# Patient Record
Sex: Female | Born: 2000 | Hispanic: No | Marital: Single | Smoking: Never smoker
Health system: Southern US, Community
[De-identification: ages and names within clinical notes are randomized; demographics above are authoritative.]

## PROBLEM LIST (undated history)

## (undated) DIAGNOSIS — C493 Malignant neoplasm of connective and soft tissue of thorax: Secondary | ICD-10-CM

## (undated) DIAGNOSIS — K3580 Unspecified acute appendicitis: Secondary | ICD-10-CM

## (undated) DIAGNOSIS — C499 Malignant neoplasm of connective and soft tissue, unspecified: Secondary | ICD-10-CM

## (undated) HISTORY — DX: Unspecified acute appendicitis: K35.80

## (undated) HISTORY — DX: Malignant neoplasm of connective and soft tissue of thorax: C49.3

## (undated) HISTORY — PX: TONSILLECTOMY: SUR1361

## (undated) HISTORY — PX: WISDOM TOOTH EXTRACTION: SHX21

---
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
## 2019-01-01 ENCOUNTER — Other Ambulatory Visit: Payer: Self-pay | Admitting: Nurse Practitioner

## 2019-01-01 DIAGNOSIS — R221 Localized swelling, mass and lump, neck: Secondary | ICD-10-CM

## 2019-01-01 DIAGNOSIS — Z85831 Personal history of malignant neoplasm of soft tissue: Secondary | ICD-10-CM

## 2019-01-01 NOTE — Telephone Encounter (Signed)
Prior authorization for the MRI was denied. Please advise.  Based on [employer] On[employer] Imaging Guidelines Section: ONC 1.1 Key Principles, we cannot approve this request. Your records show that you have had cancer in the past. The reason this request cannot be approved is because: 1. There is insufficient clinical information to approve the requested study. This information would include results of laboratory studies relevant to the imaging procedure requested. Imaging is not supported without these results and, therefore, the request is not indicated at this time. 2. There is insufficient clinical information to approve the requested study. This information would include results of imaging studies relevant to the imaging procedure requested. Imaging is not supported without these results and, therefore, the request is not indicated at this time. 3. The clinical information provided does not describe the results of a recent (generally within 60 days) focused clinical evaluation demonstrating new or worsening signs, symptoms, and/or laboratory values that indicate a possible recurrence or progression of disease. Ongoing routine requested study of patients who are asymptomatic or have stable chronic symptoms is not supported for this malignancy and, therefore, the request is not indicated at this time.

## 2019-01-01 NOTE — Telephone Encounter (Signed)
Thank you. I have ordered an ultrasound to schedule instead.

## 2019-01-02 NOTE — Telephone Encounter (Addendum)
Patient is scheduled at [facility] 01/09/2019 @ 1:15 pm.  Can you notify patient of appointment?

## 2019-01-02 NOTE — Telephone Encounter (Signed)
Completed prior authorization for CPT [phone] on the [facility] website. Prior authorization was [id number], valid 01/02/19-07/01/19).

## 2019-01-02 NOTE — Telephone Encounter (Signed)
Called the pt and informed her that the appointment is 01/09/19 at 1:15 at [facility]

## 2019-01-09 ENCOUNTER — Ambulatory Visit (HOSPITAL_COMMUNITY)
Admission: RE | Admit: 2019-01-09 | Discharge: 2019-01-09 | Disposition: A | Discharge status: Home / Self Care | Payer: Medicaid Other | Source: Ambulatory Visit | Attending: Nurse Practitioner | Admitting: Nurse Practitioner

## 2019-01-09 ENCOUNTER — Other Ambulatory Visit: Payer: Self-pay

## 2019-01-09 DIAGNOSIS — R221 Localized swelling, mass and lump, neck: Secondary | ICD-10-CM | POA: Diagnosis present

## 2019-01-09 DIAGNOSIS — Z85831 Personal history of malignant neoplasm of soft tissue: Secondary | ICD-10-CM | POA: Insufficient documentation

## 2019-01-17 ENCOUNTER — Telehealth: Payer: Self-pay

## 2019-01-17 NOTE — Telephone Encounter (Signed)
Patient called in to see if her ultrasound results are back.

## 2019-01-22 ENCOUNTER — Telehealth: Payer: Self-pay

## 2019-01-22 NOTE — Telephone Encounter (Signed)
Pt. Was informed on ultrasound results and would like to be refer to ENT.  Will route to PCP.

## 2019-01-23 ENCOUNTER — Other Ambulatory Visit: Payer: Self-pay | Admitting: Nurse Practitioner

## 2019-01-23 DIAGNOSIS — R221 Localized swelling, mass and lump, neck: Secondary | ICD-10-CM

## 2019-02-13 DIAGNOSIS — R590 Localized enlarged lymph nodes: Secondary | ICD-10-CM | POA: Insufficient documentation

## 2019-08-16 ENCOUNTER — Ambulatory Visit: Payer: Medicaid Other | Attending: Internal Medicine

## 2019-08-16 DIAGNOSIS — Z23 Encounter for immunization: Secondary | ICD-10-CM

## 2019-08-16 NOTE — Progress Notes (Signed)
   Covid-19 Vaccination Clinic  Name:  Christina Weeks    MRN: [id number] DOB: [birth year]  08/16/2019  [name] was observed post Covid-19 immunization for 15 minutes without incident. She was provided with Vaccine Information Sheet and instruction to access the V-Safe system.   [name] was instructed to call 911 with any severe reactions post vaccine: Marland Kitchen Difficulty breathing  . Swelling of face and throat  . A fast heartbeat  . A bad rash all over body  . Dizziness and weakness   Immunizations Administered    Name Date Dose VIS Date Route   Pfizer COVID-19 Vaccine 08/16/2019 11:52 AM 0.3 mL 05/16/2019 Intramuscular   Manufacturer: [place]   Lot: [id number]   [place]: [id number]

## 2019-09-08 ENCOUNTER — Ambulatory Visit: Payer: Medicaid Other

## 2019-09-09 ENCOUNTER — Ambulatory Visit: Payer: Medicaid Other | Attending: Internal Medicine

## 2019-09-09 DIAGNOSIS — Z23 Encounter for immunization: Secondary | ICD-10-CM

## 2019-09-09 NOTE — Progress Notes (Signed)
   Covid-19 Vaccination Clinic  Name:  Christina Weeks    MRN: [id number] DOB: 05-09-2001  09/09/2019  Christina Weeks was observed post Covid-19 immunization for 15 minutes without incident. She was provided with Vaccine Information Sheet and instruction to access the V-Safe system.   Christina Weeks was instructed to call 911 with any severe reactions post vaccine: Marland Kitchen Difficulty breathing  . Swelling of face and throat  . A fast heartbeat  . A bad rash all over body  . Dizziness and weakness   Immunizations Administered    Name Date Dose VIS Date Route   Pfizer COVID-19 Vaccine 09/09/2019 11:27 AM 0.3 mL 05/16/2019 Intramuscular   Manufacturer: [employer]   Lot: [id number]   [place]: [id number]

## 2020-08-19 IMAGING — CT CT ABD-PELV W/ CM
2 of 4 series · 16 of 46 positions shown, 18 images · IV contrast (omnipaque)
Comparison: None.

CLINICAL DATA: Nausea, vomiting, chills and lower abdominal pain
beginning at 2 a.m. last night.

EXAM:
CT ABDOMEN AND PELVIS WITH CONTRAST
TECHNIQUE: Multidetector CT imaging of the abdomen and pelvis was performed
using the standard protocol following bolus administration of
intravenous contrast.
CONTRAST:  100 mL OMNIPAQUE IOHEXOL 300 MG/ML  SOLN

[Series 3: abdomen 5.0 · axial · 0.62mm/px · z∈[-620,-214]mm · 13 of 91 slices shown, 15 images]
[im 5/91  soft-tissue]
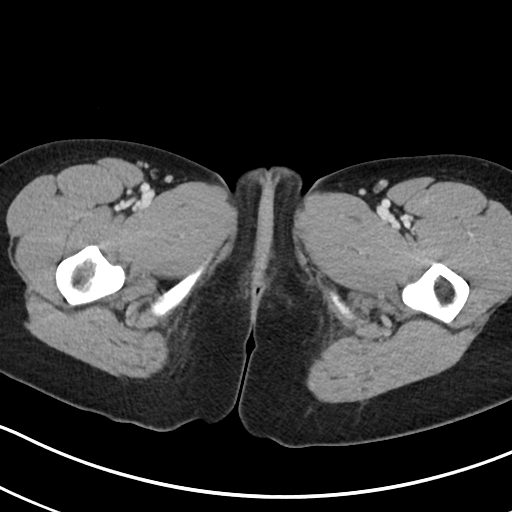
[im 5/91  bone]
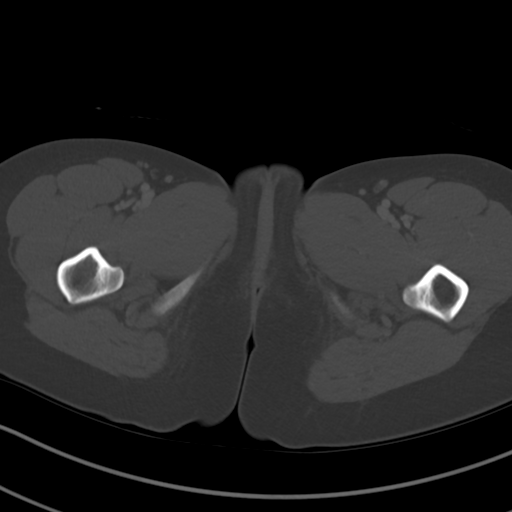
[im 15/91  soft-tissue]
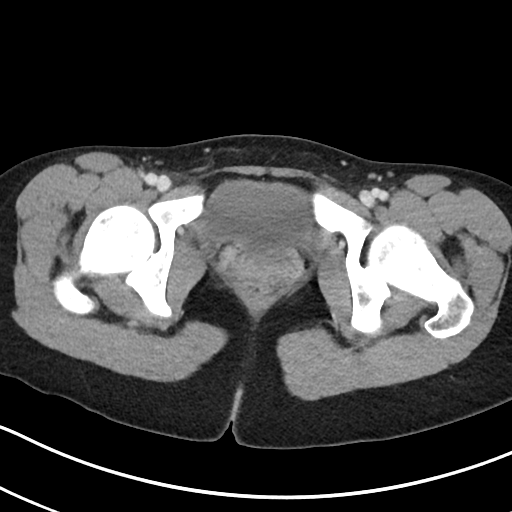
[im 19/91  soft-tissue]
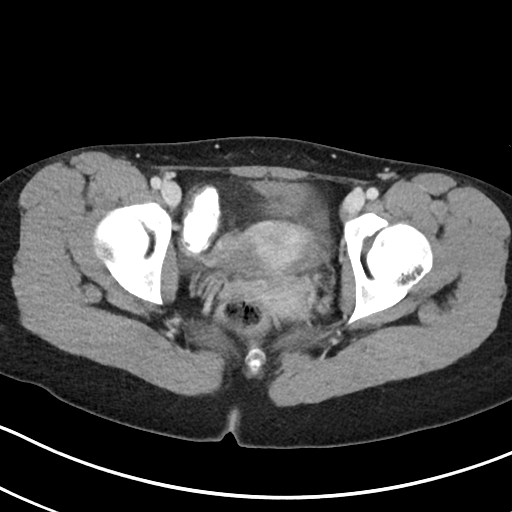
[im 24/91  soft-tissue]
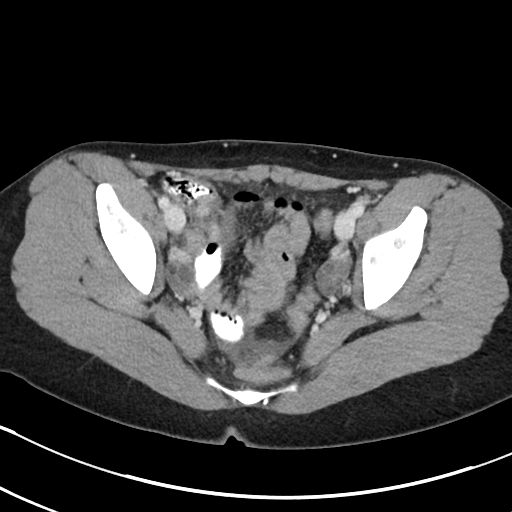
[im 34/91  soft-tissue]
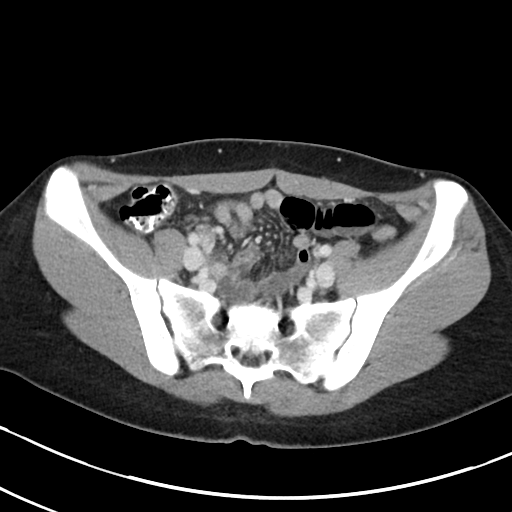
[im 38/91  soft-tissue]
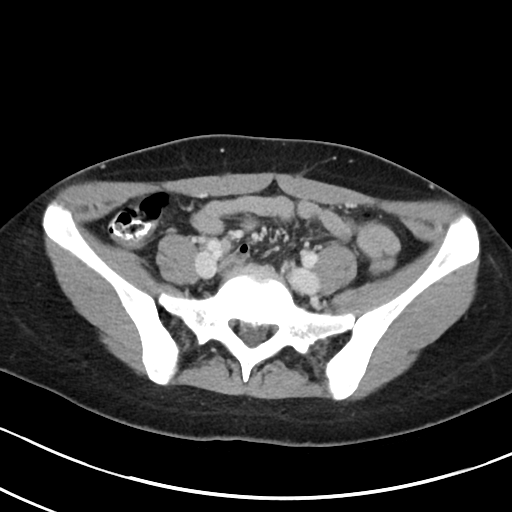
[im 48/91  soft-tissue]
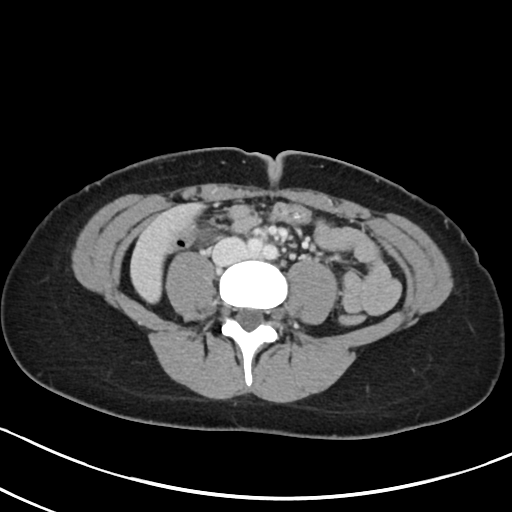
[im 53/91  soft-tissue]
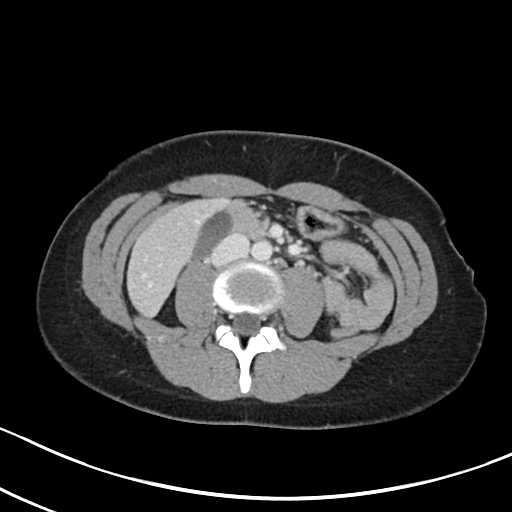
[im 57/91  soft-tissue]
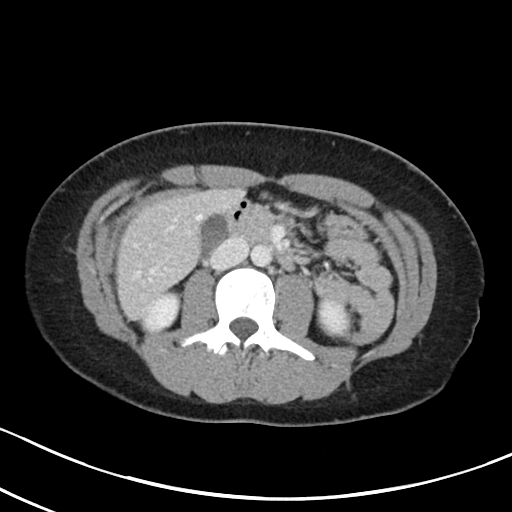
[im 57/91  bone]
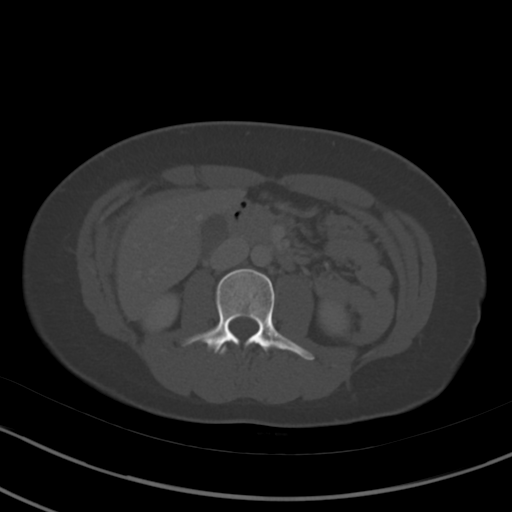
[im 67/91  soft-tissue]
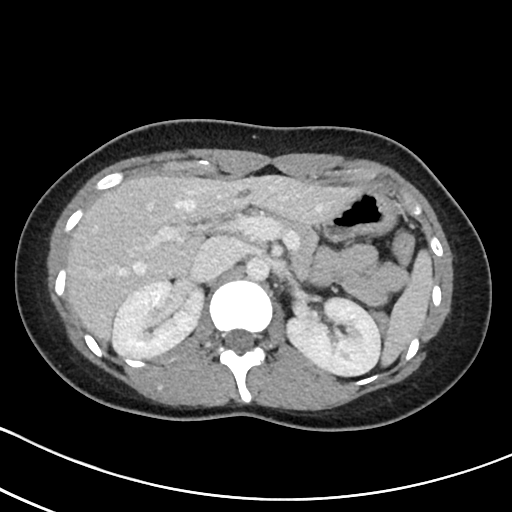
[im 72/91  soft-tissue]
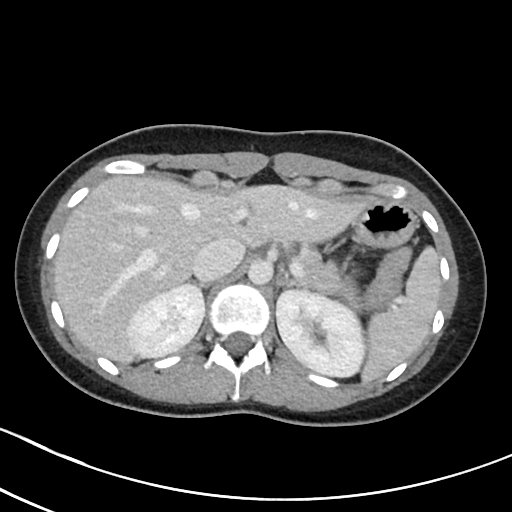
[im 76/91  soft-tissue]
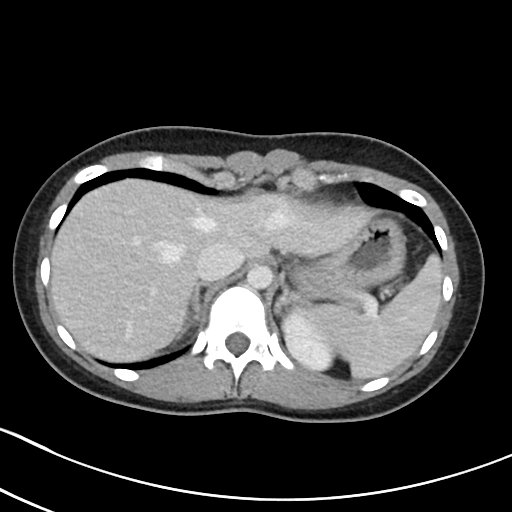
[im 86/91  soft-tissue]
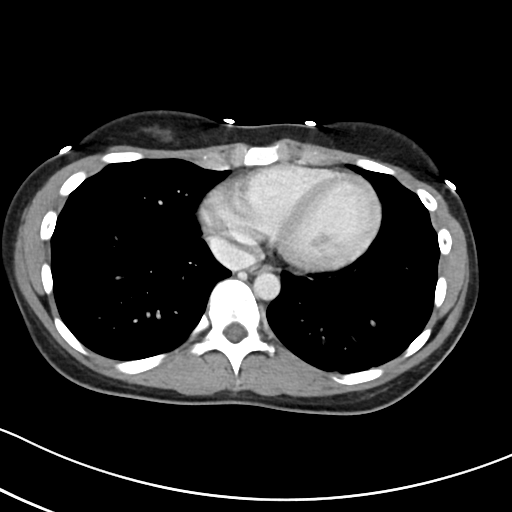

[Series 6: abdomen 3.0 mpr cor · coronal · 0.64mm/px · 3 of 68 slices shown]
[im 23/68  soft-tissue]
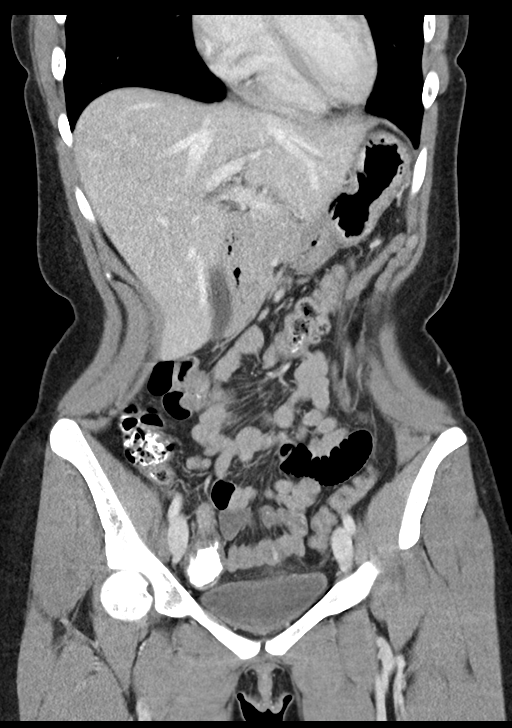
[im 30/68  soft-tissue]
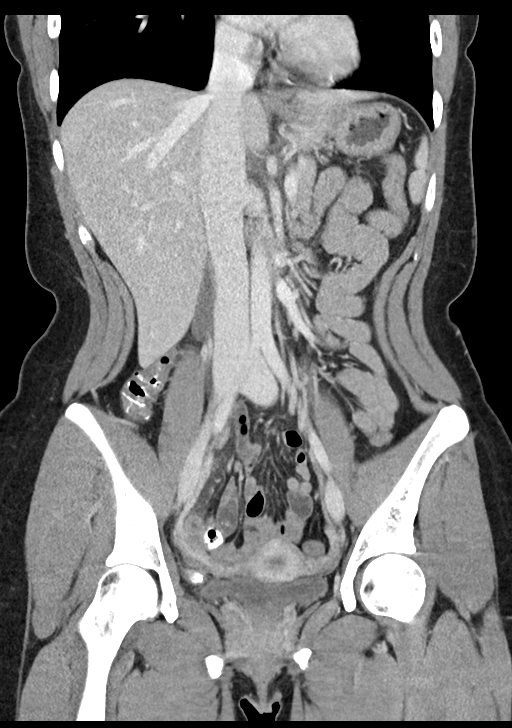
[im 38/68  soft-tissue]
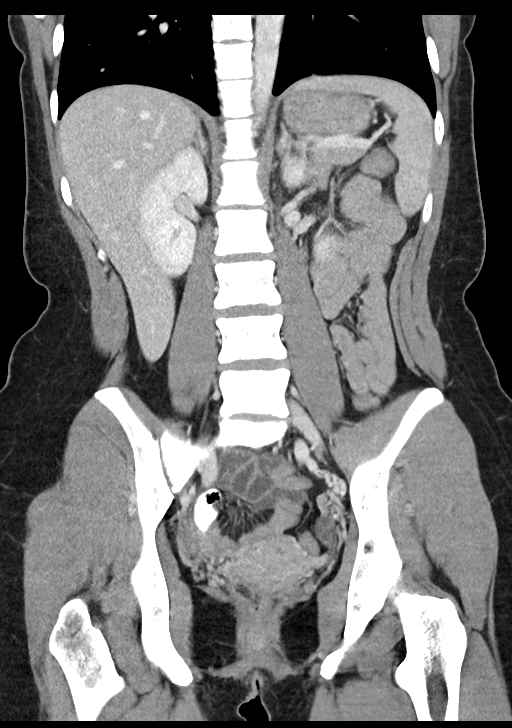

[16 of 46 positions shown; findings below may reference images not displayed]

FINDINGS: Lower chest: Minimal dependent atelectasis in the bases, more
notable on the left. Lung bases otherwise clear. No pleural or
pericardial effusion. Heart size is normal.

Hepatobiliary: No focal liver abnormality is seen. No gallstones,
gallbladder wall thickening, or biliary dilatation.

Pancreas: Unremarkable. No pancreatic ductal dilatation or
surrounding inflammatory changes.

Spleen: Normal in size without focal abnormality.

Adrenals/Urinary Tract: Adrenal glands are unremarkable. Kidneys are
normal, without renal calculi, focal lesion, or hydronephrosis.
Bladder is unremarkable.

Stomach/Bowel: The appendix is dilated at approximately 1.5 cm with
hyperenhancement of its walls and surrounding stranding consistent
with acute appendicitis. No abscess or perforation. The stomach and
small and large bowel appear normal.

Vascular/Lymphatic: No significant vascular findings are present. No
enlarged abdominal or pelvic lymph nodes.

Reproductive: Uterus and bilateral adnexa are unremarkable.

Other: Small amount of free pelvic fluid noted.

Musculoskeletal: Negative.
IMPRESSION: The examination is positive for acute appendicitis.

These results were called by telephone at the time of interpretation
on 06/03/2018 at [DATE] to PAULUS N CEEJAY. P.A., Who verbally
acknowledged these results.

## 2021-01-20 ENCOUNTER — Encounter: Payer: Medicaid Other | Admitting: Family Medicine

## 2021-01-20 ENCOUNTER — Encounter: Payer: Self-pay | Admitting: Family Medicine

## 2021-01-20 ENCOUNTER — Other Ambulatory Visit: Payer: Self-pay

## 2021-01-20 DIAGNOSIS — Z23 Encounter for immunization: Secondary | ICD-10-CM

## 2021-01-20 NOTE — Progress Notes (Deleted)
Pt presents for annual exam declines pap due to menstrual cycle started., desires TDAP and HPV vaccine

## 2021-01-21 NOTE — Progress Notes (Signed)
Patient was not seen and wants to reschedule 2/20mnses

## 2021-03-27 IMAGING — US SOFT TISSUE ULTRASOUND HEAD/NECK
1 series · 7 of 7 positions shown · non-contrast
Comparison: None.

CLINICAL DATA: Lump x4 months, history of sarcoma

EXAM:
ULTRASOUND OF HEAD/NECK SOFT TISSUES
TECHNIQUE: Ultrasound examination of the head and neck soft tissues was
performed in the area of clinical concern.

[Series 1: soft tissue ultrasound head/neck · 7 acquisitions, 7 frames shown]
[im 1/7]
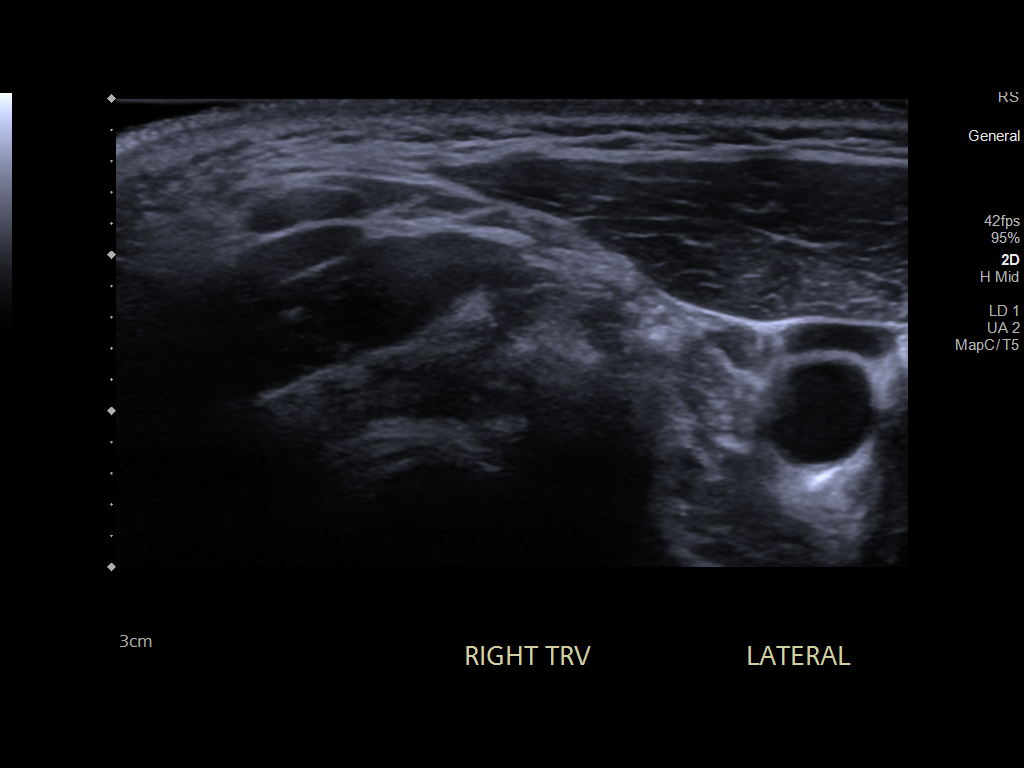
[im 2/7]
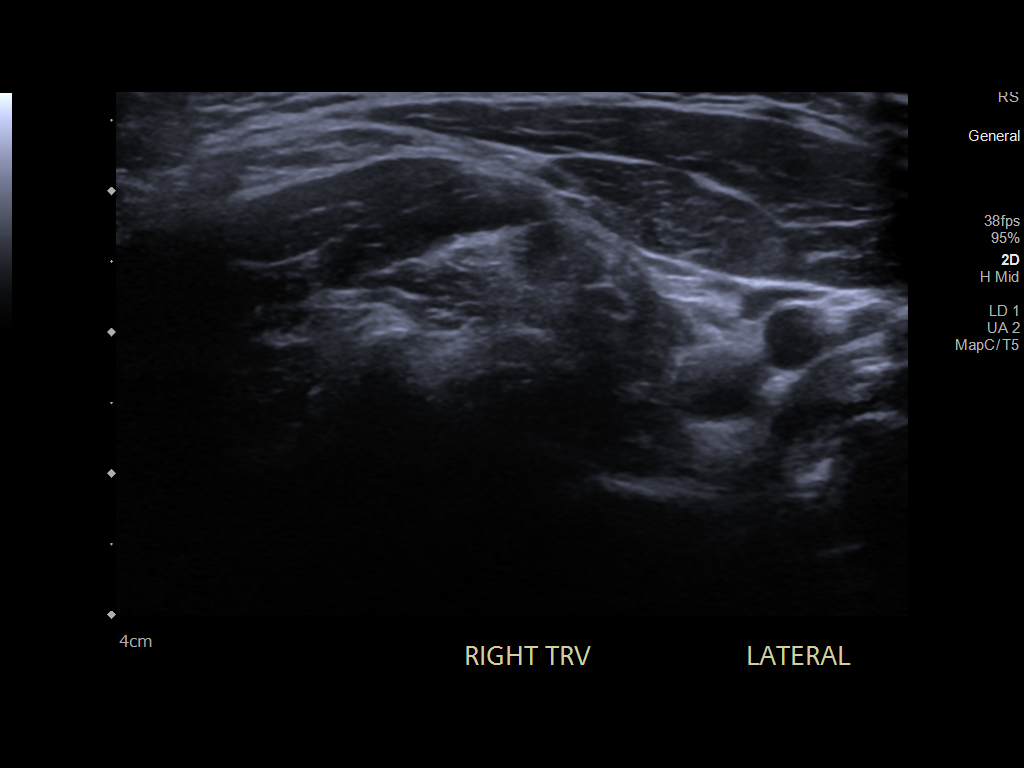
[im 3/7]
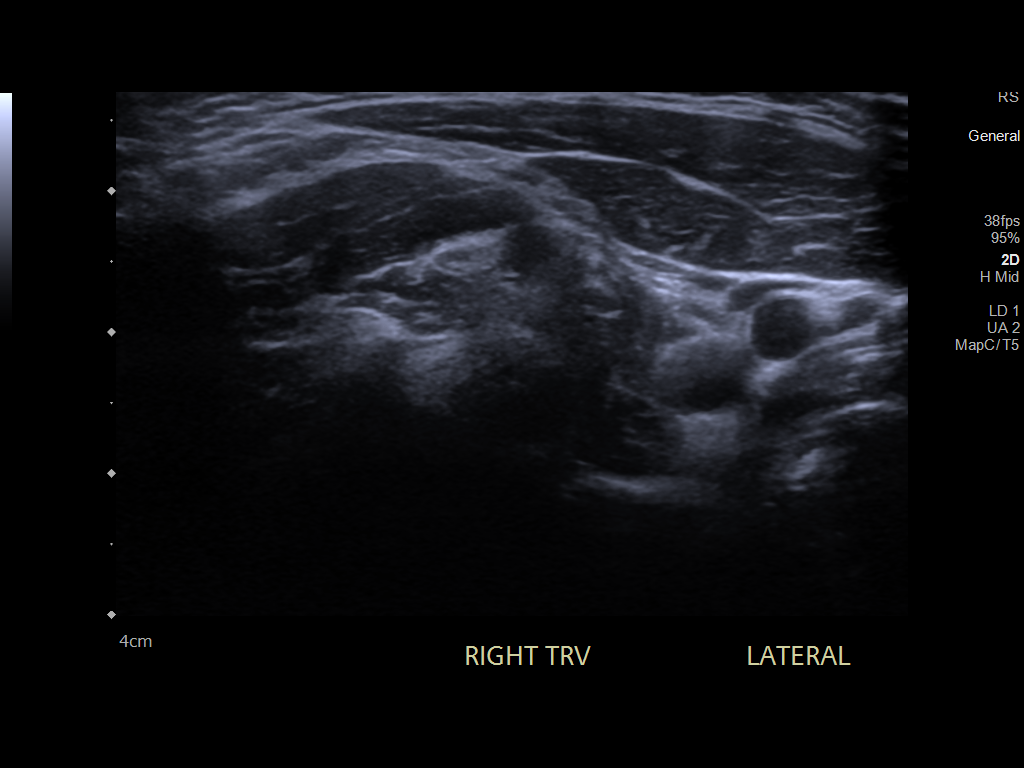
[im 4/7]
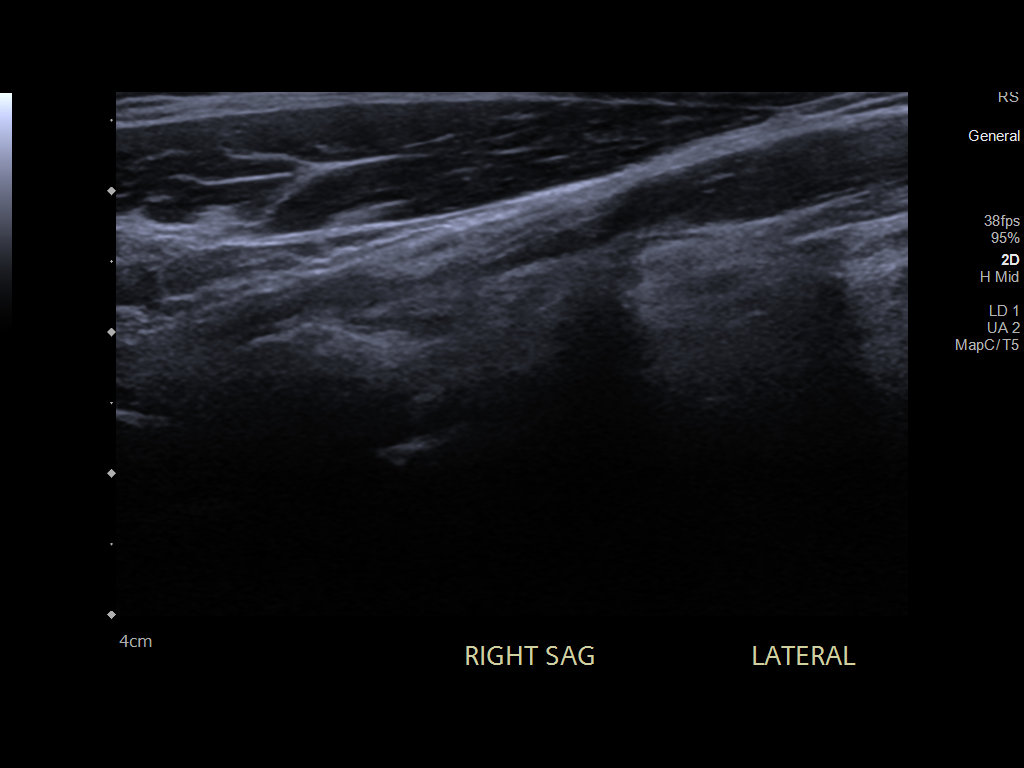
[im 5/7]
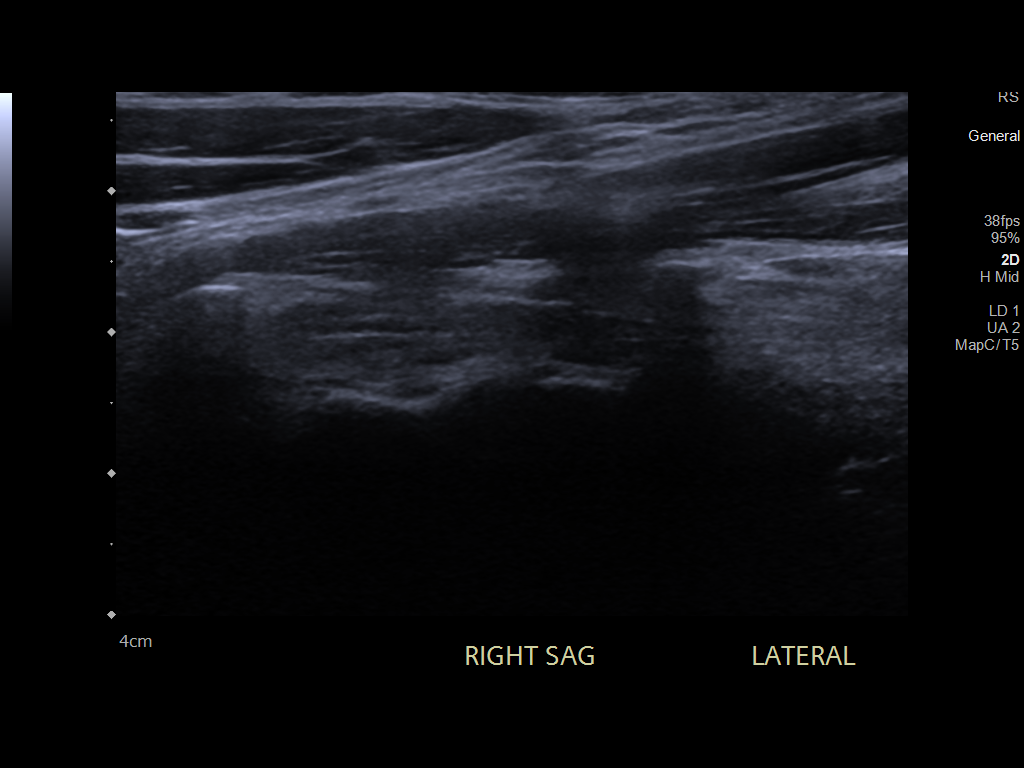
[im 6/7]
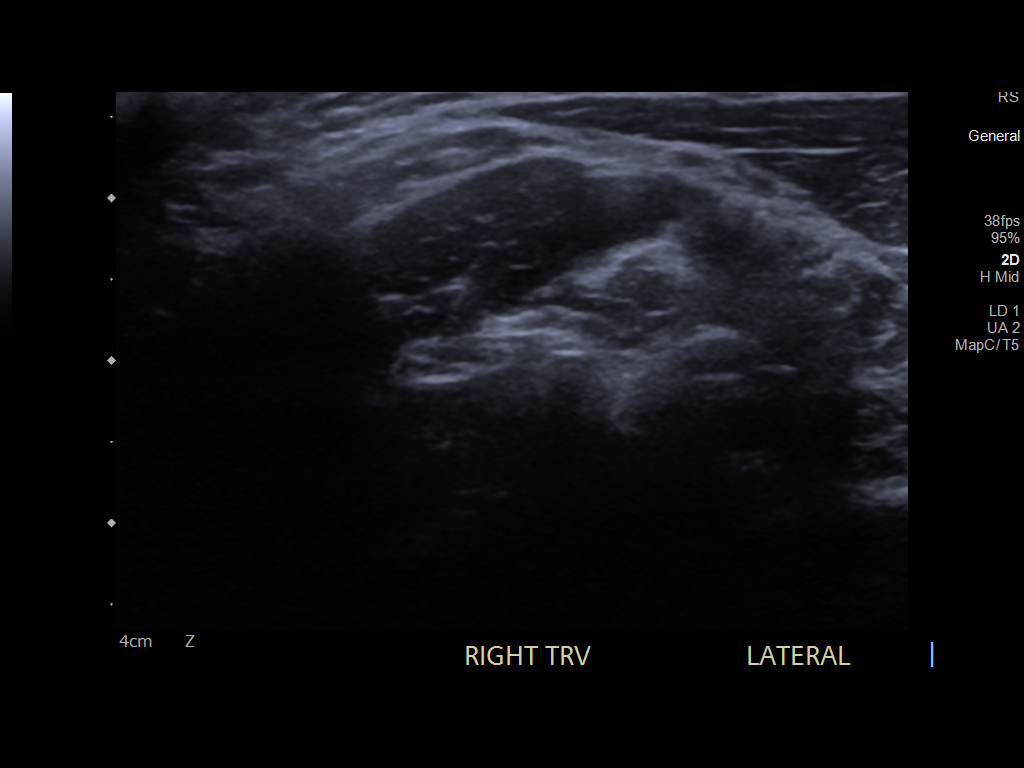
[im 7/7]
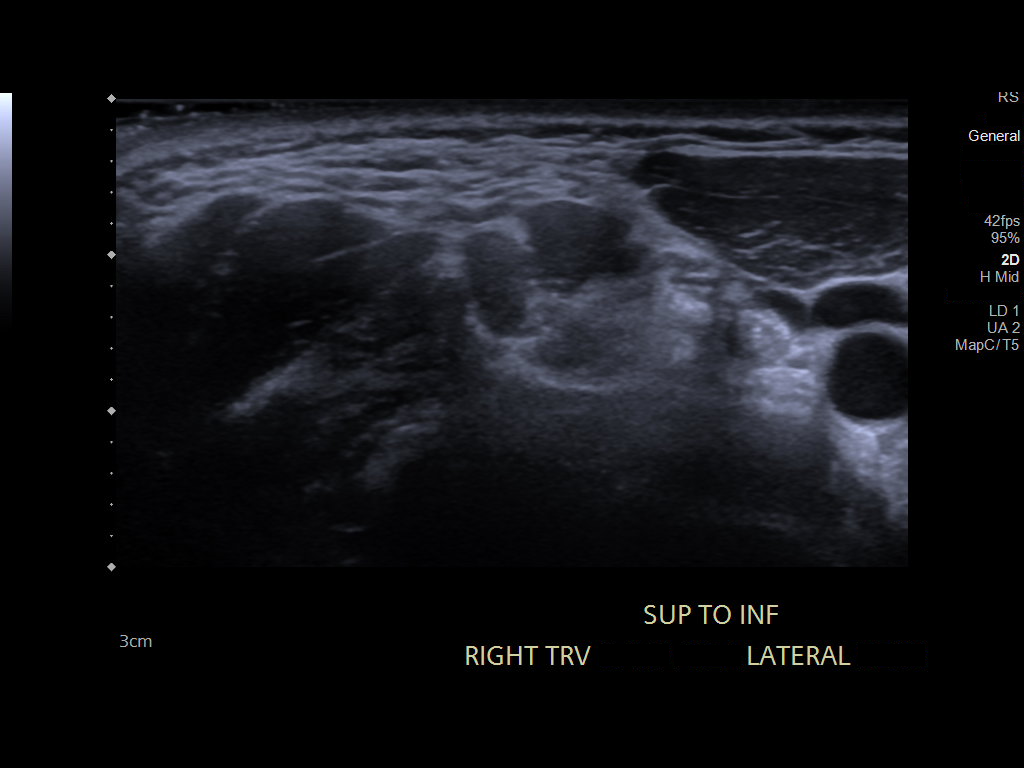

[7 of 7 positions shown; findings below may reference images not displayed]

FINDINGS: Survey ultrasound of the right neck in the region of concern shows
no mass, cyst, abscess, aneurysm, adenopathy, or other pathologic
findings.
IMPRESSION: 1. No pathologic findings on ultrasound.

## 2021-12-19 ENCOUNTER — Other Ambulatory Visit (HOSPITAL_COMMUNITY)
Admission: RE | Admit: 2021-12-19 | Discharge: 2021-12-19 | Disposition: A | Discharge status: Home / Self Care | Payer: Medicaid Other | Source: Ambulatory Visit | Attending: Family Medicine | Admitting: Family Medicine

## 2021-12-19 ENCOUNTER — Encounter: Payer: Self-pay | Admitting: Family Medicine

## 2021-12-19 ENCOUNTER — Ambulatory Visit: Payer: Medicaid Other | Admitting: Family Medicine

## 2021-12-19 VITALS — BP 104/69 | HR 73 | Temp 98.9°F | Resp 16 | Ht 63.0 in | Wt 144.0 lb

## 2021-12-19 DIAGNOSIS — Z0000 Encounter for general adult medical examination without abnormal findings: Secondary | ICD-10-CM

## 2021-12-19 DIAGNOSIS — Z130 Encounter for screening for diseases of the blood and blood-forming organs and certain disorders involving the immune mechanism: Secondary | ICD-10-CM | POA: Diagnosis not present

## 2021-12-19 DIAGNOSIS — Z13220 Encounter for screening for lipoid disorders: Secondary | ICD-10-CM | POA: Diagnosis not present

## 2021-12-19 DIAGNOSIS — Z124 Encounter for screening for malignant neoplasm of cervix: Secondary | ICD-10-CM

## 2021-12-19 DIAGNOSIS — Z1159 Encounter for screening for other viral diseases: Secondary | ICD-10-CM

## 2021-12-19 DIAGNOSIS — Z7689 Persons encountering health services in other specified circumstances: Secondary | ICD-10-CM

## 2021-12-19 NOTE — Addendum Note (Signed)
Addended by: Dalbert Mayotte E on: 12/19/2021 02:50 PM   Modules accepted: Orders

## 2021-12-19 NOTE — Progress Notes (Signed)
New Patient Office Visit  Subjective    Patient ID: Christina Weeks, female    DOB: April 20, 2001  Age: 21 y.o. MRN: [id number]  CC:  Chief Complaint  Patient presents with   Annual Exam   Gynecologic Exam    HPI Clinton presents to establish care and for routine annual exam with pap. Patient denies acute complaints or concerns.    No outpatient encounter medications on file as of 12/19/2021.   No facility-administered encounter medications on file as of 12/19/2021.    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[facility])     Past Surgical History:  Procedure Laterality Date   LAPAROSCOPIC APPENDECTOMY N/A 06/03/2018   Procedure: APPENDECTOMY LAPAROSCOPIC;  Surgeon: Kinsinger, Arta Bruce, MD;  Location: [facility] OR;  Service: General;  Laterality: N/A;   TONSILLECTOMY     WISDOM TOOTH EXTRACTION      Family History  Problem Relation Age of Onset   Diabetes Mother    Diabetes Maternal Grandmother    Diabetes Maternal Grandfather     Social History   Socioeconomic History   Marital status: Single    Spouse name: Not on file   Number of children: Not on file   Years of education: Not on file   Highest education level: Not on file  Occupational History   Not on file  Tobacco Use   Smoking status: Never   Smokeless tobacco: Never  Vaping Use   Vaping Use: Never used  Substance and Sexual Activity   Alcohol use: Never   Drug use: Never   Sexual activity: Not on file  Other Topics Concern   Not on file  Social History Narrative   Not on file   Social Determinants of Health   Financial Resource Strain: Not on file  Food Insecurity: Not on file  Transportation Needs: Not on file  Physical Activity: Not on file  Stress: Not on file  Social Connections: Not on file  Intimate Partner Violence: Not on file    Review of Systems  All other systems reviewed and are negative.        Objective    BP 104/69   Pulse 73   Temp 98.9 F (37.2 C) (Oral)   Resp 16   Ht $R'5\' 3"'Wq$  (1.6 m)   Wt 144 lb (65.3 kg)   BMI 25.51 kg/m   Physical Exam Vitals and nursing note reviewed.  Constitutional:      General: She is not in acute distress. HENT:     Head: Normocephalic and atraumatic.     Right Ear: Tympanic membrane, ear canal and external ear normal.     Left Ear: Tympanic membrane, ear canal and external ear normal.     Nose: Nose normal.     Mouth/Throat:     Mouth: Mucous membranes are moist.     Pharynx: Oropharynx is clear.  Eyes:     Conjunctiva/sclera: Conjunctivae normal.     Pupils: Pupils are equal, round, and reactive to light.  Neck:     Thyroid: No thyromegaly.  Cardiovascular:     Rate and Rhythm: Normal rate and regular rhythm.     Heart sounds: Normal heart sounds. No murmur heard. Pulmonary:     Effort: Pulmonary effort is normal. No respiratory distress.     Breath sounds: Normal breath sounds.  Abdominal:     General: There is no distension.  Palpations: Abdomen is soft. There is no mass.     Tenderness: There is no abdominal tenderness.     Hernia: There is no hernia in the left inguinal area or right inguinal area.  Genitourinary:    Exam position: Supine.     Labia:        Right: No lesion.        Left: No lesion.      Vagina: Normal.     Cervix: Normal.     Uterus: Normal.      Adnexa: Right adnexa normal.  Musculoskeletal:        General: Normal range of motion.     Cervical back: Normal range of motion and neck supple.  Skin:    General: Skin is warm and dry.  Neurological:     General: No focal deficit present.     Mental Status: She is alert and oriented to person, place, and time.  Psychiatric:        Mood and Affect: Mood normal.        Behavior: Behavior normal.         Assessment & Plan:   1. Annual physical exam  - CMP14+EGFR  2. Screening for cervical cancer  - Cytology - PAP  3. Screening for  deficiency anemia  - CBC with Differential  4. Screening for lipid disorders  - Lipid Panel  5. Need for hepatitis C screening test  - Hepatitis C Antibody  6. Encounter to establish care     No follow-ups on file.   Becky Sax, MD

## 2021-12-19 NOTE — Progress Notes (Signed)
Patient is here for complete physical examination with Gynecology exam  Patient has no concerns for provider

## 2021-12-20 LAB — CMP14+EGFR
ALT: 14 IU/L (ref 0–32)
AST: 20 IU/L (ref 0–40)
Albumin/Globulin Ratio: 1.8 (ref 1.2–2.2)
Albumin: 4.4 g/dL (ref 4.0–5.0)
Alkaline Phosphatase: 89 IU/L (ref 44–121)
BUN/Creatinine Ratio: 15 (ref 9–23)
BUN: 11 mg/dL (ref 6–20)
Bilirubin Total: 0.3 mg/dL (ref 0.0–1.2)
CO2: 25 mmol/L (ref 20–29)
Calcium: 9.2 mg/dL (ref 8.7–10.2)
Chloride: 103 mmol/L (ref 96–106)
Creatinine, Ser: 0.71 mg/dL (ref 0.57–1.00)
Globulin, Total: 2.5 g/dL (ref 1.5–4.5)
Glucose: 80 mg/dL (ref 70–99)
Potassium: 4.2 mmol/L (ref 3.5–5.2)
Sodium: 140 mmol/L (ref 134–144)
Total Protein: 6.9 g/dL (ref 6.0–8.5)
eGFR: 123 mL/min/{1.73_m2} (ref >59)

## 2021-12-20 LAB — CBC WITH DIFFERENTIAL/PLATELET
Basophils Absolute: 0.1 10*3/uL (ref 0.0–0.2)
Basos: 1 %
EOS (ABSOLUTE): 0.2 10*3/uL (ref 0.0–0.4)
Eos: 3 %
Hematocrit: 45.2 % (ref 34.0–46.6)
Hemoglobin: 14.8 g/dL (ref 11.1–15.9)
Immature Grans (Abs): 0 10*3/uL (ref 0.0–0.1)
Immature Granulocytes: 0 %
Lymphocytes Absolute: 2.2 10*3/uL (ref 0.7–3.1)
Lymphs: 32 %
MCH: 28.3 pg (ref 26.6–33.0)
MCHC: 32.7 g/dL (ref 31.5–35.7)
MCV: 86 fL (ref 79–97)
Monocytes Absolute: 0.4 10*3/uL (ref 0.1–0.9)
Monocytes: 7 %
Neutrophils Absolute: 3.9 10*3/uL (ref 1.4–7.0)
Neutrophils: 57 %
Platelets: 287 10*3/uL (ref 150–450)
RBC: 5.23 x10E6/uL (ref 3.77–5.28)
RDW: 12.6 % (ref 11.7–15.4)
WBC: 6.8 10*3/uL (ref 3.4–10.8)

## 2021-12-20 LAB — CERVICOVAGINAL ANCILLARY ONLY
Bacterial Vaginitis (gardnerella): POSITIVE — AB
Candida Glabrata: NEGATIVE
Candida Vaginitis: NEGATIVE
Chlamydia: NEGATIVE
Comment: NEGATIVE
Comment: NEGATIVE
Comment: NEGATIVE
Comment: NEGATIVE
Comment: NEGATIVE
Comment: NORMAL
Neisseria Gonorrhea: NEGATIVE
Trichomonas: NEGATIVE

## 2021-12-20 LAB — LIPID PANEL
Chol/HDL Ratio: 2.2 ratio (ref 0.0–4.4)
Cholesterol, Total: 153 mg/dL (ref 100–199)
HDL: 70 mg/dL (ref >39)
LDL Chol Calc (NIH): 74 mg/dL (ref 0–99)
Triglycerides: 41 mg/dL (ref 0–149)
VLDL Cholesterol Cal: 9 mg/dL (ref 5–40)

## 2021-12-20 LAB — HIV ANTIBODY (ROUTINE TESTING W REFLEX): HIV Screen 4th Generation wRfx: NONREACTIVE

## 2021-12-20 LAB — HEPATITIS C ANTIBODY: Hep C Virus Ab: NONREACTIVE

## 2021-12-21 ENCOUNTER — Other Ambulatory Visit: Payer: Self-pay | Admitting: Family Medicine

## 2021-12-21 MED ORDER — METRONIDAZOLE 500 MG PO TABS: 500.0000 mg | ORAL_TABLET | Freq: Two times a day (BID) | ORAL | 0 refills | Status: AC

## 2021-12-23 LAB — CYTOLOGY - PAP: Diagnosis: NEGATIVE

## 2022-06-07 ENCOUNTER — Ambulatory Visit
Admission: EM | Admit: 2022-06-07 | Discharge: 2022-06-07 | Disposition: A | Discharge status: Home / Self Care | Payer: Medicaid Other | Attending: Physician Assistant | Admitting: Physician Assistant

## 2022-06-07 DIAGNOSIS — N760 Acute vaginitis: Secondary | ICD-10-CM | POA: Insufficient documentation

## 2022-06-07 DIAGNOSIS — R109 Unspecified abdominal pain: Secondary | ICD-10-CM | POA: Diagnosis present

## 2022-06-07 LAB — POCT URINALYSIS DIP (MANUAL ENTRY)
Bilirubin, UA: NEGATIVE
Blood, UA: NEGATIVE
Glucose, UA: NEGATIVE mg/dL
Ketones, POC UA: NEGATIVE mg/dL
Leukocytes, UA: NEGATIVE
Nitrite, UA: NEGATIVE
Protein Ur, POC: NEGATIVE mg/dL
Spec Grav, UA: >=1.03 — AB (ref 1.010–1.025)
Urobilinogen, UA: 0.2 E.U./dL
pH, UA: 5.5 (ref 5.0–8.0)

## 2022-06-07 LAB — POCT URINE PREGNANCY: Preg Test, Ur: NEGATIVE

## 2022-06-07 MED ORDER — IBUPROFEN 600 MG PO TABS: 600.0000 mg | ORAL_TABLET | Freq: Four times a day (QID) | ORAL | 0 refills | Status: DC | PRN

## 2022-06-07 NOTE — ED Provider Notes (Signed)
[facility]    CSN: [id number] Arrival date & time: 06/07/22  0816      History   Chief Complaint Chief Complaint  Patient presents with   Vaginal Discharge    HPI Christina Weeks is a 22 y.o. female.   22 year old female presents with cramping and vaginal discharge.  Patient indicates for the past 2 days she has been having increasing vaginal discharge with cramping associated.  Patient indicates that the discharge is thick, creamy white and yellow, and clumpy.  Patient indicates that there is an odor associated with the discharge.  Patient also indicates that she is having a lot of lower abdominal cramping and discomfort which is connected with the discharge.  She relates that the cramping on a scale of 1-10 is a 7.  She relates it causes her considerable discomfort.  Patient denies nausea, vomiting, fever or chills.  Patient denies any urinary symptoms such as frequency, urgency, or dysuria.  Patient indicates that she is not taking any OTC medications for the discharge for the cramping.  She indicates that her last period was December 22 and occurred at normal time.  Patient indicates her last sexual encounter was several days ago and it was protected.   Vaginal Discharge   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[facility])     Patient Active Problem List   Diagnosis Date Noted   Enlarged lymph node in neck 02/13/2019   Acute appendicitis 06/03/2018   Sarcoma of chest wall ([place]) 05/18/2011    Past Surgical History:  Procedure Laterality Date   LAPAROSCOPIC APPENDECTOMY N/A 06/03/2018   Procedure: APPENDECTOMY LAPAROSCOPIC;  Surgeon: Mickeal Skinner, MD;  Location: [place];  Service: General;  Laterality: N/A;   TONSILLECTOMY     WISDOM TOOTH EXTRACTION      OB History   No obstetric history on file.      Home Medications    Prior to Admission medications   Medication Sig Start Date End Date  Taking? Authorizing Provider  ibuprofen (ADVIL) 600 MG tablet Take 1 tablet (600 mg total) by mouth every 6 (six) hours as needed. 06/07/22  Yes Nyoka Lint, PA-C    Family History Family History  Problem Relation Age of Onset   Diabetes Mother    Diabetes Maternal Grandmother    Diabetes Maternal Grandfather     Social History Social History   Tobacco Use   Smoking status: Never   Smokeless tobacco: Never  Vaping Use   Vaping Use: Never used  Substance Use Topics   Alcohol use: Never   Drug use: Never     Allergies   Patient has no known allergies.   Review of Systems Review of Systems  Genitourinary:  Positive for vaginal discharge (white and yellow thick with clumps).     Physical Exam Triage Vital Signs ED Triage Vitals [06/07/22 0827]  Enc Vitals Group     BP 120/82     Pulse Rate 80     Resp 16     Temp 98.1 F (36.7 C)     Temp Source Oral     SpO2 98 %     Weight      Height      Head Circumference      Peak Flow      Pain Score 7     Pain Loc      Pain Edu?  Excl. in [place]?    No data found.  Updated Vital Signs BP 120/82 (BP Location: Right Arm)   Pulse 80   Temp 98.1 F (36.7 C) (Oral)   Resp 16   SpO2 98%   Visual Acuity Right Eye Distance:   Left Eye Distance:   Bilateral Distance:    Right Eye Near:   Left Eye Near:    Bilateral Near:     Physical Exam Constitutional:      Appearance: Normal appearance.  Abdominal:     General: Abdomen is flat. Bowel sounds are normal.     Palpations: Abdomen is soft.     Tenderness: There is abdominal tenderness in the suprapubic area. There is no guarding or rebound.    Neurological:     Mental Status: She is alert.      UC Treatments / Results  Labs (all labs ordered are listed, but only abnormal results are displayed) Labs Reviewed  POCT URINALYSIS DIP (MANUAL ENTRY) - Abnormal; Notable for the following components:      Result Value   Clarity, UA hazy (*)    Spec Grav,  UA >=1.030 (*)    All other components within normal limits  POCT URINE PREGNANCY  CERVICOVAGINAL ANCILLARY ONLY    EKG   Radiology No results found.  Procedures Procedures (including critical care time)  Medications Ordered in UC Medications - No data to display  Initial Impression / Assessment and Plan / UC Course  I have reviewed the triage vital signs and the nursing notes.  Pertinent labs & imaging results that were available during my care of the patient were reviewed by me and considered in my medical decision making (see chart for details).    Plan: 1.  The abdominal cramping will be treated with the following: A.  Ibuprofen 600 mg every 6 hours with food to treat cramping and discomfort. 2.  The vaginitis will be treated with the following: A.  STI testing is pending and treatment will be considered depending on testing results. 3.  Patient advised to follow-up with PCP or return to urgent care if symptoms fail to improve. Final Clinical Impressions(s) / UC Diagnoses   Final diagnoses:  Vaginitis and vulvovaginitis  Abdominal cramping     Discharge Instructions      Testing will be completed in 48 hours.  If you do not get a call from this office that indicates the test are negative.  Log onto MyChart to view the test results when they post in 48 hours.  If any of the testing is positive then you will get a call from this office and indicate which test are positive and treatment that is advised.  Advised take ibuprofen 600 mg every 8 hours on a regular basis with food as this will help decrease the cramping and abdominal discomfort.  Has follow-up PCP or return to urgent care if symptoms fail to improve.    ED Prescriptions     Medication Sig Dispense Auth. Provider   ibuprofen (ADVIL) 600 MG tablet Take 1 tablet (600 mg total) by mouth every 6 (six) hours as needed. 30 tablet Nyoka Lint, PA-C      PDMP not reviewed this encounter.   Nyoka Lint, PA-C 06/07/22 [phone]

## 2022-06-07 NOTE — Discharge Instructions (Addendum)
Testing will be completed in 48 hours.  If you do not get a call from this office that indicates the test are negative.  Log onto MyChart to view the test results when they post in 48 hours.  If any of the testing is positive then you will get a call from this office and indicate which test are positive and treatment that is advised.  Advised take ibuprofen 600 mg every 8 hours on a regular basis with food as this will help decrease the cramping and abdominal discomfort.  Has follow-up PCP or return to urgent care if symptoms fail to improve.

## 2022-06-07 NOTE — ED Triage Notes (Signed)
Pt c/o vaginal discharge (clumps), odor, abd cramps   Onset last night

## 2022-06-08 LAB — CERVICOVAGINAL ANCILLARY ONLY
Bacterial Vaginitis (gardnerella): POSITIVE — AB
Candida Glabrata: NEGATIVE
Candida Vaginitis: NEGATIVE
Chlamydia: NEGATIVE
Comment: NEGATIVE
Comment: NEGATIVE
Comment: NEGATIVE
Comment: NEGATIVE
Comment: NEGATIVE
Comment: NORMAL
Neisseria Gonorrhea: NEGATIVE
Trichomonas: NEGATIVE

## 2022-06-09 ENCOUNTER — Encounter (HOSPITAL_COMMUNITY): Payer: Self-pay | Admitting: Emergency Medicine

## 2022-06-09 ENCOUNTER — Telehealth (HOSPITAL_COMMUNITY): Payer: Self-pay | Admitting: Emergency Medicine

## 2022-06-09 ENCOUNTER — Ambulatory Visit (HOSPITAL_COMMUNITY)
Admission: EM | Admit: 2022-06-09 | Discharge: 2022-06-09 | Disposition: A | Discharge status: Home / Self Care | Payer: Medicaid Other | Attending: Emergency Medicine | Admitting: Emergency Medicine

## 2022-06-09 DIAGNOSIS — N730 Acute parametritis and pelvic cellulitis: Secondary | ICD-10-CM | POA: Insufficient documentation

## 2022-06-09 DIAGNOSIS — Z20822 Contact with and (suspected) exposure to covid-19: Secondary | ICD-10-CM | POA: Diagnosis not present

## 2022-06-09 DIAGNOSIS — Z20828 Contact with and (suspected) exposure to other viral communicable diseases: Secondary | ICD-10-CM

## 2022-06-09 DIAGNOSIS — Z113 Encounter for screening for infections with a predominantly sexual mode of transmission: Secondary | ICD-10-CM | POA: Insufficient documentation

## 2022-06-09 DIAGNOSIS — N760 Acute vaginitis: Secondary | ICD-10-CM | POA: Diagnosis not present

## 2022-06-09 DIAGNOSIS — B9689 Other specified bacterial agents as the cause of diseases classified elsewhere: Secondary | ICD-10-CM | POA: Diagnosis not present

## 2022-06-09 LAB — POCT URINALYSIS DIPSTICK, ED / UC
Bilirubin Urine: NEGATIVE
Glucose, UA: NEGATIVE mg/dL
Leukocytes,Ua: NEGATIVE
Nitrite: NEGATIVE
Protein, ur: NEGATIVE mg/dL
Specific Gravity, Urine: 1.015 (ref 1.005–1.030)
Urobilinogen, UA: 0.2 mg/dL (ref 0.0–1.0)
pH: 5 (ref 5.0–8.0)

## 2022-06-09 LAB — POC INFLUENZA A AND B ANTIGEN (URGENT CARE ONLY)
INFLUENZA A ANTIGEN, POC: NEGATIVE
INFLUENZA B ANTIGEN, POC: NEGATIVE

## 2022-06-09 LAB — POC URINE PREG, ED: Preg Test, Ur: NEGATIVE

## 2022-06-09 MED ORDER — METRONIDAZOLE 500 MG PO TABS: 500.0000 mg | ORAL_TABLET | Freq: Two times a day (BID) | ORAL | 0 refills | Status: DC

## 2022-06-09 MED ORDER — LIDOCAINE HCL (PF) 1 % IJ SOLN
INTRAMUSCULAR | Status: AC
  Filled 2022-06-09: qty 2

## 2022-06-09 MED ORDER — DOXYCYCLINE HYCLATE 100 MG PO CAPS: 100.0000 mg | ORAL_CAPSULE | Freq: Two times a day (BID) | ORAL | 0 refills | Status: AC

## 2022-06-09 MED ORDER — ONDANSETRON 8 MG PO TBDP: ORAL_TABLET | ORAL | 0 refills | Status: DC

## 2022-06-09 MED ORDER — CEFTRIAXONE SODIUM 500 MG IJ SOLR
500.0000 mg | INTRAMUSCULAR | Status: DC
  Administered 2022-06-09: 500 mg via INTRAMUSCULAR

## 2022-06-09 MED ORDER — METRONIDAZOLE 500 MG PO TABS: 500.0000 mg | ORAL_TABLET | Freq: Two times a day (BID) | ORAL | 0 refills | Status: AC

## 2022-06-09 MED ORDER — CEFTRIAXONE SODIUM 500 MG IJ SOLR
INTRAMUSCULAR | Status: AC
  Filled 2022-06-09: qty 500

## 2022-06-09 NOTE — Discharge Instructions (Signed)
Your influenza was negative.  I am treating you presumptively for pelvic inflammatory disease here, which I believe is caused by BV, with 500 mg of Rocephin here.  I am giving you 2 weeks of doxycycline, Flagyl for the PID and Zofran for nausea.  Take 600 mg of ibuprofen, 1000 mg of Tylenol 3 times a day as needed for pain, fever.  Go to the ER if you get worse.

## 2022-06-09 NOTE — ED Provider Notes (Signed)
HPI  SUBJECTIVE:  Christina Weeks is a 22 y.o. female who presents with lower abdominal cramping, pain, 2 episodes of nonbilious, nonbloody emesis that woke her up last night.  She is tolerating small amounts of fluids today.  She states that the cramping is getting worse, is now lasting hours.  She reports fevers Tmax 102, weakness, anorexia, and continued yellow, clumpy white vaginal discharge.  She had a normal bowel movement this morning, did not change her pain.  No abdominal distention.  She states that the car ride over here was painful.  She has been taking ibuprofen with improvement in the pain.  Symptoms are worse with walking, movement.  Is not associated with bowel movements, p.o. intake, urination.  She also reports dysuria, cloudy urine.  She states that she has not been sexually active since Wednesday.  Denies COVID or flu symptoms of bodyaches, headaches, nasal congestion, cough, wheeze, shortness of breath.  She has a past medical history of appendicitis and status post appendectomy, BV.  No history of gonorrhea, chlamydia, herpes, HIV, syphilis, trichomonas or yeast infections.  No history of PID, PCOS, TOA, ovarian cyst, UTI, pyelonephritis, nephrolithiasis.  LMP: 12/22.  PCP: [facility].  Patient was seen at the UC 2 days ago with abdominal pain, cramping and vaginal discharge.  Sent home with 600 mg of ibuprofen.  Urine pregnancy came back negative, UA negative for UTI.  Self swab for gonorrhea, chlamydia, trichomonas, yeast negative.  It came back positive for BV and she was prescribed Flagyl today, which she has not yet started.  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[facility])     Past Surgical History:  Procedure Laterality Date   LAPAROSCOPIC APPENDECTOMY N/A 06/03/2018   Procedure: APPENDECTOMY LAPAROSCOPIC;  Surgeon: Kinsinger, Arta Bruce, MD;  Location: [facility] OR;  Service: General;  Laterality: N/A;    TONSILLECTOMY     WISDOM TOOTH EXTRACTION      Family History  Problem Relation Age of Onset   Diabetes Mother    Diabetes Maternal Grandmother    Diabetes Maternal Grandfather     Social History   Tobacco Use   Smoking status: Never   Smokeless tobacco: Never  Vaping Use   Vaping Use: Never used  Substance Use Topics   Alcohol use: Never   Drug use: Never     Current Facility-Administered Medications:    cefTRIAXone (ROCEPHIN) injection 500 mg, 500 mg, Intramuscular, Q24H, Melynda Ripple, MD  Current Outpatient Medications:    doxycycline (VIBRAMYCIN) 100 MG capsule, Take 1 capsule (100 mg total) by mouth 2 (two) times daily for 14 days., Disp: 28 capsule, Rfl: 0   metroNIDAZOLE (FLAGYL) 500 MG tablet, Take 1 tablet (500 mg total) by mouth 2 (two) times daily for 14 days., Disp: 28 tablet, Rfl: 0   ondansetron (ZOFRAN-ODT) 8 MG disintegrating tablet, 1/2- 1 tablet q 8 hr prn nausea, vomiting, Disp: 20 tablet, Rfl: 0   ibuprofen (ADVIL) 600 MG tablet, Take 1 tablet (600 mg total) by mouth every 6 (six) hours as needed., Disp: 30 tablet, Rfl: 0  No Known Allergies   ROS  As noted in HPI.   Physical Exam  BP 108/75 (BP Location: Right Arm)   Pulse 75   Temp 98.5 F (36.9 C) (Oral)   Resp 15   LMP 05/26/2022   SpO2 97%   Constitutional: Well developed, well nourished, no acute distress Eyes:  EOMI, conjunctiva  normal bilaterally HENT: Normocephalic, atraumatic,mucus membranes moist Respiratory: Normal inspiratory effort Cardiovascular: Normal rate GI: nondistended.  Soft.  Positive suprapubic and bilateral lower quadrant tenderness.  Left flank tenderness.  Active bowel sounds.  No rebound, guarding.  Tap table test negative. Back: Questionable left CVAT GU: Normal external genitalia.  Extensive white thin vaginal discharge.  Normal os.  Positive CMT.  Positive uterine tenderness.  No adnexal tenderness.  No adnexal masses.  Chaperone and parent present during  exam. skin: No rash, skin intact Musculoskeletal: no deformities Neurologic: Alert & oriented x 3, no focal neuro deficits Psychiatric: Speech and behavior appropriate   ED Course   Medications  cefTRIAXone (ROCEPHIN) injection 500 mg (has no administration in time range)    Orders Placed This Encounter  Procedures   Pelvic exam    Standing Status:   Standing    Number of Occurrences:   1   SARS CORONAVIRUS 2 (TAT 6-24 HRS) Anterior Nasal Swab    Standing Status:   Standing    Number of Occurrences:   1   Droplet precaution    Standing Status:   Standing    Number of Occurrences:   1   POC urine pregnancy    Standing Status:   Standing    Number of Occurrences:   1   POC Urinalysis dipstick    Standing Status:   Standing    Number of Occurrences:   1   POC Influenza A & B Ag (Urgent Care)    Standing Status:   Standing    Number of Occurrences:   1    Results for orders placed or performed during the hospital encounter of 06/09/22 (from the past 24 hour(s))  POC Urinalysis dipstick     Status: Abnormal   Collection Time: 06/09/22  4:07 PM  Result Value Ref Range   Glucose, UA NEGATIVE NEGATIVE mg/dL   Bilirubin Urine NEGATIVE NEGATIVE   Ketones, ur TRACE (A) NEGATIVE mg/dL   Specific Gravity, Urine 1.015 1.005 - 1.030   Hgb urine dipstick TRACE (A) NEGATIVE   pH 5.0 5.0 - 8.0   Protein, ur NEGATIVE NEGATIVE mg/dL   Urobilinogen, UA 0.2 0.0 - 1.0 mg/dL   Nitrite NEGATIVE NEGATIVE   Leukocytes,Ua NEGATIVE NEGATIVE  POC urine pregnancy     Status: None   Collection Time: 06/09/22  4:19 PM  Result Value Ref Range   Preg Test, Ur NEGATIVE NEGATIVE  POC Influenza A & B Ag (Urgent Care)     Status: None   Collection Time: 06/09/22  4:44 PM  Result Value Ref Range   INFLUENZA A ANTIGEN, POC NEGATIVE NEGATIVE   INFLUENZA B ANTIGEN, POC NEGATIVE NEGATIVE   No results found.  ED Clinical Impression  1. PID (acute pelvic inflammatory disease)   2. BV (bacterial  vaginosis)   3. Screening examination for STD (sexually transmitted disease)   4. Encounter for laboratory testing for COVID-19 virus      ED Assessment/Plan     Previous records reviewed.  As noted in HPI.  Concern for PID.  Rechecking swab, but I will obtain this.  Will recheck UA, urine pregnancy and do a pelvic exam today.  Checking COVID and flu, but I favor PID with the fevers and worsening of lower abdominal cramping/pain.  I obtained a swab for gonorrhea, chlamydia and trichomonas.  BV is an infrequent cause of PID, but still because.  She is status post appendectomy, she is not pregnant today.  She has trace blood in her urine, but no nitrites or esterase.  The differential includes ovarian cyst rupture/torsion, endometriosis, diverticulitis, but I feel these are less likely based on presentation and physical exam.  Influenza negative.  Will treat as PID 500 mg of Rocephin here, home with 2 weeks of doxycycline in addition to extending the Flagyl for a total of 2 weeks.  Will send home with Zofran as well.  Work note.  Discussed labs, MDM, treatment plan, and plan for follow-up with patient and parent. Discussed sn/sx that should prompt return to the ED. they agree with plan.   Meds ordered this encounter  Medications   cefTRIAXone (ROCEPHIN) injection 500 mg    Order Specific Question:   Antibiotic Indication:    Answer:   STD   metroNIDAZOLE (FLAGYL) 500 MG tablet    Sig: Take 1 tablet (500 mg total) by mouth 2 (two) times daily for 14 days.    Dispense:  28 tablet    Refill:  0   doxycycline (VIBRAMYCIN) 100 MG capsule    Sig: Take 1 capsule (100 mg total) by mouth 2 (two) times daily for 14 days.    Dispense:  28 capsule    Refill:  0   ondansetron (ZOFRAN-ODT) 8 MG disintegrating tablet    Sig: 1/2- 1 tablet q 8 hr prn nausea, vomiting    Dispense:  20 tablet    Refill:  0      *This clinic note was created using [employer]. Therefore, there may  be occasional mistakes despite careful proofreading.  ?    Melynda Ripple, MD 06/09/22 1702

## 2022-06-09 NOTE — ED Triage Notes (Signed)
Pt c/o abd cramps, headache, fever, vomiting, body aches that started Tuesday night. Reports was seen at [place] and symptoms are worse. Took Ibuprofen '600mg'$  that was prescribed that helps until medication wears off.

## 2022-06-10 LAB — SARS CORONAVIRUS 2 (TAT 6-24 HRS): SARS Coronavirus 2: NEGATIVE

## 2022-06-12 LAB — CERVICOVAGINAL ANCILLARY ONLY
Chlamydia: NEGATIVE
Comment: NEGATIVE
Comment: NEGATIVE
Comment: NORMAL
Neisseria Gonorrhea: NEGATIVE
Trichomonas: NEGATIVE

## 2022-11-06 ENCOUNTER — Ambulatory Visit (HOSPITAL_BASED_OUTPATIENT_CLINIC_OR_DEPARTMENT_OTHER): Payer: Medicaid Other | Admitting: Family Medicine

## 2022-11-06 ENCOUNTER — Encounter (HOSPITAL_BASED_OUTPATIENT_CLINIC_OR_DEPARTMENT_OTHER): Payer: Self-pay | Admitting: Family Medicine

## 2022-11-06 VITALS — BP 114/75 | HR 73 | Temp 97.8°F | Ht 63.0 in | Wt 150.6 lb

## 2022-11-06 DIAGNOSIS — N926 Irregular menstruation, unspecified: Secondary | ICD-10-CM

## 2022-11-06 DIAGNOSIS — N915 Oligomenorrhea, unspecified: Secondary | ICD-10-CM

## 2022-11-06 DIAGNOSIS — Z23 Encounter for immunization: Secondary | ICD-10-CM

## 2022-11-06 NOTE — Progress Notes (Unsigned)
New Patient Office Visit  Subjective    Patient ID: Christina Weeks, female    DOB: 08/20/2000  Age: 22 y.o. MRN: [id number]  HPI Christina Weeks presents to establish care. She is concerned about irregular menstrual cycles.   Former PCP: Dr. Andrey Campanile- review of notes patient only saw on 12/19/2021   She reports that she started her menstrual cycle at age 44, with mostly regular cycles She reports that she was taking OCPs in the past and eventually decided to have a Nexplanon inserted.  She had her Nexplanon for a few months and reports that she started bleeding for an entire month- had it removed Jan 2022. Since then, she has been using condoms if she is sexually active.   Noticed her cycles were irregular but over the past few months, they seemed to start to regulate. Now, she reports that she missed a period. Reports that she took a home pregnancy test last week that was negative. Denies blood in stool, hematuria, current abdominal pain, or pelvic discomfort. Denies excessive bleeding. LMP: 11/05/2022 She reports that she's been diagnosed with BV in the past. Review of notes,diagnosed with PID 06/09/2022.   Previous menstrual periods:  11/22-11/26 12/22-12/26 1/28-2/1 3/17-3/23  6/2-current   Outpatient Encounter Medications as of 11/06/2022  Medication Sig   ibuprofen (ADVIL) 600 MG tablet Take 1 tablet (600 mg total) by mouth every 6 (six) hours as needed.   [DISCONTINUED] ondansetron (ZOFRAN-ODT) 8 MG disintegrating tablet 1/2- 1 tablet q 8 hr prn nausea, vomiting (Patient not taking: Reported on 11/06/2022)   No facility-administered encounter medications on file as of 11/06/2022.    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[facility])     Past Surgical History:  Procedure Laterality Date   LAPAROSCOPIC APPENDECTOMY N/A 06/03/2018   Procedure: APPENDECTOMY LAPAROSCOPIC;  Surgeon: Kinsinger, De Blanch, MD;   Location: [facility] OR;  Service: General;  Laterality: N/A;   TONSILLECTOMY     WISDOM TOOTH EXTRACTION      Family History  Problem Relation Age of Onset   Diabetes Mother    Diabetes Maternal Grandmother    Diabetes Maternal Grandfather     Social History   Socioeconomic History   Marital status: Single    Spouse name: Not on file   Number of children: Not on file   Years of education: Not on file   Highest education level: Not on file  Occupational History   Not on file  Tobacco Use   Smoking status: Never   Smokeless tobacco: Never  Vaping Use   Vaping Use: Never used  Substance and Sexual Activity   Alcohol use: Never   Drug use: Never   Sexual activity: Not on file  Other Topics Concern   Not on file  Social History Narrative   Not on file   Social Determinants of Health   Financial Resource Strain: Not on file  Food Insecurity: Not on file  Transportation Needs: Not on file  Physical Activity: Not on file  Stress: Not on file  Social Connections: Not on file  Intimate Partner Violence: Not on file    Review of Systems  Constitutional:  Negative for chills and fever.  Eyes:  Negative for blurred vision and double vision.  Respiratory:  Negative for cough and shortness of breath.   Cardiovascular:  Negative for chest pain.  Gastrointestinal:  Negative for abdominal pain, nausea and vomiting.  Genitourinary:  Negative for dysuria, flank pain, frequency, hematuria and urgency.  Musculoskeletal:  Negative for myalgias.  Neurological:  Negative for dizziness, weakness and headaches.  Psychiatric/Behavioral:  Negative for suicidal ideas.    Objective    BP 114/75   Pulse 73   Temp 97.8 F (36.6 C) (Oral)   Ht 5\' 3"  (1.6 m)   Wt 150 lb 9.6 oz (68.3 kg)   LMP 11/06/2022 (Exact Date)   SpO2 99%   BMI 26.68 kg/m   Physical Exam Constitutional:      Appearance: Normal appearance.  Cardiovascular:     Rate and Rhythm: Normal rate and regular rhythm.      Pulses: Normal pulses.     Heart sounds: Normal heart sounds.  Pulmonary:     Effort: Pulmonary effort is normal.     Breath sounds: Normal breath sounds.  Neurological:     Mental Status: She is alert.  Psychiatric:        Mood and Affect: Mood normal.        Behavior: Behavior normal.        Thought Content: Thought content normal.        Judgment: Judgment normal.    Assessment & Plan:   1. Irregular menses Patient presents with oligomenorrhea. Denies lower abdominal pain and pelvic discomfort. Denies menorrhagia and metrorrhagia. Denies issues with hyperandrogenism. Patient is well-appearing and does not appear acutely ill. No indication for pelvic and/or speculum exam at today's visit since patient does not have any presenting symptoms. Advised patient if she becomes symptomatic, we could perform additional testing- including NuSwab for bacterial vaginosis, vaginal candidiasis, mycoplasmas, and other sexually transmitted infections (STIs). Will assess for abnormal thyroid function, (pre)diabetes, hyperprolactinemia, and hormonal changes. Will notify patient with results and further plan of care.  - TSH Rfx on Abnormal to Free T4 - Hemoglobin A1c - Prolactin - Estradiol - FSH/LH  2. Oligomenorrhea, unspecified type See #1  Patient agreeable to update Tdap booster immunization today.  - Tdap vaccine greater than or equal to 7yo IM    Return in about 7 weeks (around 12/25/2022) for Physical.   Alyson Reedy, FNP

## 2022-11-07 DIAGNOSIS — N926 Irregular menstruation, unspecified: Secondary | ICD-10-CM | POA: Insufficient documentation

## 2022-11-07 DIAGNOSIS — N915 Oligomenorrhea, unspecified: Secondary | ICD-10-CM | POA: Insufficient documentation

## 2022-11-07 LAB — HEMOGLOBIN A1C
Est. average glucose Bld gHb Est-mCnc: 111 mg/dL
Hgb A1c MFr Bld: 5.5 % (ref 4.8–5.6)

## 2022-11-07 LAB — TSH RFX ON ABNORMAL TO FREE T4: TSH: 3.28 u[IU]/mL (ref 0.450–4.500)

## 2022-11-07 LAB — PROLACTIN: Prolactin: 18.3 ng/mL (ref 4.8–33.4)

## 2022-11-07 LAB — ESTRADIOL: Estradiol: 25.3 pg/mL

## 2022-11-08 LAB — FSH/LH
FSH: 7.4 m[IU]/mL
LH: 6.4 m[IU]/mL

## 2022-11-08 LAB — SPECIMEN STATUS REPORT

## 2022-12-25 ENCOUNTER — Encounter (HOSPITAL_BASED_OUTPATIENT_CLINIC_OR_DEPARTMENT_OTHER): Payer: Self-pay | Admitting: Family Medicine

## 2022-12-25 ENCOUNTER — Ambulatory Visit (INDEPENDENT_AMBULATORY_CARE_PROVIDER_SITE_OTHER): Payer: Medicaid Other | Admitting: Family Medicine

## 2022-12-25 VITALS — BP 99/76 | HR 75 | Ht 63.0 in | Wt 146.0 lb

## 2022-12-25 DIAGNOSIS — Z0000 Encounter for general adult medical examination without abnormal findings: Secondary | ICD-10-CM | POA: Diagnosis not present

## 2022-12-25 DIAGNOSIS — E559 Vitamin D deficiency, unspecified: Secondary | ICD-10-CM

## 2022-12-25 NOTE — Assessment & Plan Note (Signed)
Recent labs discussed with patient, including hemoglobin A1c and thyroid function. Additional labs added- lipid panel, CMP, and vitamin D. Will obtain labs today and update patient with results.  Review of PMH, FH, SH, medications and HM performed.  Recommend healthy diet.  Recommend approximately 150 minutes/week of moderate intensity exercise. Recommend regular dental and vision exams. Always use seatbelt/lap and shoulder restraints. Recommend using smoke alarms and checking batteries at least twice a year. Recommend using sunscreen when outside. Discussed immunization recommendations. Vaccines are UTD.   Vaccines are up to date:  yes Colonoscopy:  not of age MMG:  not of age, discussed self breast exams BMD: not of age Last pap smear:  12/19/2021, normal

## 2022-12-25 NOTE — Progress Notes (Signed)
Complete physical exam  Patient: Christina Weeks   DOB: 07-Dec-2000   22 y.o. Female  MRN: [id number]  Subjective:    Christina Weeks is a 22 y.o. female who presents today for a complete physical exam. She reports consuming a general diet- avoids eating sugary foods and red meats. Home exercise routine includes treadmill and walking 1 hrs per day. Tries to work-out everyday of the week. She generally feels well. She reports sleeping well. She does not have additional problems to discuss today.   Works 3rd shift for [employer]  LMP- beginning of July 2024  Depression screenings:    12/25/2022   10:10 AM 12/19/2021    1:33 PM 01/20/2021    4:06 PM  Depression screen PHQ 2/9  Decreased Interest 0 0 0  Down, Depressed, Hopeless 0 0 0  PHQ - 2 Score 0 0 0  Altered sleeping  0   Tired, decreased energy  0   Change in appetite  0   Feeling bad or failure about yourself   0   Trouble concentrating  0   Moving slowly or fidgety/restless  0   Suicidal thoughts  0   PHQ-9 Score  0   Difficult doing work/chores  Not difficult at all     Anxiety screenings:    12/25/2018    2:50 PM  GAD 7 : Generalized Anxiety Score  Nervous, Anxious, on Edge 0  Control/stop worrying 0  Worry too much - different things 0  Trouble relaxing 0  Restless 0  Easily annoyed or irritable 0  Afraid - awful might happen 0  Total GAD 7 Score 0   Dentist- recommend q6 months, Nov 2023, scheduled for end of this month  Vision- recommend q1-5 years, March 2024   Patient Care Team: Alyson Reedy, FNP as PCP - General (Family Medicine)   Outpatient Medications Prior to Visit  Medication Sig   ibuprofen (ADVIL) 600 MG tablet Take 1 tablet (600 mg total) by mouth every 6 (six) hours as needed.   No facility-administered medications prior to visit.    Review of Systems  Constitutional:  Negative for malaise/fatigue.  HENT:  Negative for congestion and sinus pain.   Eyes:  Negative  for blurred vision and double vision.  Respiratory:  Negative for cough and shortness of breath.   Cardiovascular:  Negative for chest pain and palpitations.  Gastrointestinal:  Negative for abdominal pain, nausea and vomiting.  Genitourinary:  Negative for frequency and urgency.  Musculoskeletal:  Negative for myalgias.  Neurological:  Negative for dizziness, weakness and headaches.  Psychiatric/Behavioral:  Negative for depression and suicidal ideas. The patient is not nervous/anxious and does not have insomnia.       Objective:    BP 99/76   Pulse 75   Ht 5\' 3"  (1.6 m)   Wt 146 lb (66.2 kg)   LMP 12/04/2022 (Exact Date)   SpO2 99%   BMI 25.86 kg/m  BP Readings from Last 3 Encounters:  12/25/22 99/76  11/06/22 114/75  06/09/22 108/75     Physical Exam Constitutional:      Appearance: Normal appearance.  HENT:     Head: Normocephalic.     Right Ear: Tympanic membrane, ear canal and external ear normal.     Left Ear: Tympanic membrane, ear canal and external ear normal.     Nose: Nose normal.     Mouth/Throat:     Mouth: Mucous membranes are moist.  Pharynx: Oropharynx is clear.  Eyes:     Extraocular Movements: Extraocular movements intact.     Pupils: Pupils are equal, round, and reactive to light.  Cardiovascular:     Rate and Rhythm: Normal rate and regular rhythm.     Pulses: Normal pulses.     Heart sounds: Normal heart sounds.  Pulmonary:     Effort: Pulmonary effort is normal.     Breath sounds: Normal breath sounds.  Abdominal:     General: Abdomen is flat. Bowel sounds are normal.     Palpations: Abdomen is soft.  Musculoskeletal:        General: Normal range of motion.     Cervical back: Normal range of motion.  Skin:    General: Skin is warm and dry.  Neurological:     Mental Status: She is alert.  Psychiatric:        Mood and Affect: Mood normal.        Behavior: Behavior normal.        Thought Content: Thought content normal.         Judgment: Judgment normal.         Assessment & Plan:    Routine Health Maintenance and Physical Exam  Health Maintenance  Topic Date Due   COVID-19 Vaccine (3 - 2023-24 season) 02/03/2022   Flu Shot  01/04/2023   Pap Smear  12/19/2024   Pap Smear  12/19/2024   DTaP/Tdap/Td vaccine (8 - Td or Tdap) 11/05/2032   HPV Vaccine  Completed   Hepatitis C Screening  Completed   HIV Screening  Completed    Wellness examination Assessment & Plan: Recent labs discussed with patient, including hemoglobin A1c and thyroid function. Additional labs added- lipid panel, CMP, and vitamin D. Will obtain labs today and update patient with results.  Review of PMH, FH, SH, medications and HM performed.  Recommend healthy diet.  Recommend approximately 150 minutes/week of moderate intensity exercise. Recommend regular dental and vision exams. Always use seatbelt/lap and shoulder restraints. Recommend using smoke alarms and checking batteries at least twice a year. Recommend using sunscreen when outside. Discussed immunization recommendations. Vaccines are UTD.   Vaccines are up to date:  yes Colonoscopy:  not of age MMG:  not of age, discussed self breast exams BMD: not of age Last pap smear:  12/19/2021, normal   Orders: -     Comprehensive metabolic panel -     Lipid panel -     VITAMIN D 25 Hydroxy (Vit-D Deficiency, Fractures)  Vitamin D deficiency Assessment & Plan: Will check vitamin D levels today and update patient with results.   Orders: -     VITAMIN D 25 Hydroxy (Vit-D Deficiency, Fractures)    Return in about 1 year (around 12/25/2023) for Physical with fasting labs.     Alyson Reedy, FNP

## 2022-12-25 NOTE — Assessment & Plan Note (Signed)
Will check vitamin D levels today and update patient with results.

## 2022-12-26 ENCOUNTER — Other Ambulatory Visit (HOSPITAL_BASED_OUTPATIENT_CLINIC_OR_DEPARTMENT_OTHER): Payer: Self-pay | Admitting: Family Medicine

## 2022-12-26 DIAGNOSIS — E559 Vitamin D deficiency, unspecified: Secondary | ICD-10-CM

## 2022-12-26 LAB — COMPREHENSIVE METABOLIC PANEL
ALT: 14 IU/L (ref 0–32)
AST: 19 IU/L (ref 0–40)
Albumin: 4.2 g/dL (ref 4.0–5.0)
Alkaline Phosphatase: 90 IU/L (ref 44–121)
BUN/Creatinine Ratio: 18 (ref 9–23)
BUN: 12 mg/dL (ref 6–20)
Bilirubin Total: 0.3 mg/dL (ref 0.0–1.2)
CO2: 22 mmol/L (ref 20–29)
Calcium: 9 mg/dL (ref 8.7–10.2)
Chloride: 107 mmol/L — ABNORMAL HIGH (ref 96–106)
Creatinine, Ser: 0.68 mg/dL (ref 0.57–1.00)
Globulin, Total: 2.6 g/dL (ref 1.5–4.5)
Glucose: 92 mg/dL (ref 70–99)
Potassium: 4.2 mmol/L (ref 3.5–5.2)
Sodium: 142 mmol/L (ref 134–144)
Total Protein: 6.8 g/dL (ref 6.0–8.5)
eGFR: 125 mL/min/{1.73_m2} (ref >59)

## 2022-12-26 LAB — LIPID PANEL
Chol/HDL Ratio: 2.2 ratio (ref 0.0–4.4)
Cholesterol, Total: 135 mg/dL (ref 100–199)
HDL: 62 mg/dL (ref >39)
LDL Chol Calc (NIH): 62 mg/dL (ref 0–99)
Triglycerides: 50 mg/dL (ref 0–149)
VLDL Cholesterol Cal: 11 mg/dL (ref 5–40)

## 2022-12-26 LAB — VITAMIN D 25 HYDROXY (VIT D DEFICIENCY, FRACTURES): Vit D, 25-Hydroxy: 11.8 ng/mL — ABNORMAL LOW (ref 30.0–100.0)

## 2022-12-26 MED ORDER — VITAMIN D (ERGOCALCIFEROL) 1.25 MG (50000 UNIT) PO CAPS
50000.0000 [IU] | ORAL_CAPSULE | ORAL | 0 refills | Status: DC
Start: 2022-12-26 — End: 2023-12-28

## 2023-03-27 ENCOUNTER — Other Ambulatory Visit (HOSPITAL_BASED_OUTPATIENT_CLINIC_OR_DEPARTMENT_OTHER): Payer: Self-pay | Admitting: Family Medicine

## 2023-03-27 ENCOUNTER — Other Ambulatory Visit (HOSPITAL_BASED_OUTPATIENT_CLINIC_OR_DEPARTMENT_OTHER): Payer: Medicaid Other

## 2023-03-28 LAB — VITAMIN D 25 HYDROXY (VIT D DEFICIENCY, FRACTURES): Vit D, 25-Hydroxy: 24.3 ng/mL — ABNORMAL LOW (ref 30.0–100.0)

## 2023-04-26 ENCOUNTER — Encounter (HOSPITAL_BASED_OUTPATIENT_CLINIC_OR_DEPARTMENT_OTHER): Payer: Self-pay | Admitting: Family Medicine

## 2023-12-11 ENCOUNTER — Telehealth (HOSPITAL_BASED_OUTPATIENT_CLINIC_OR_DEPARTMENT_OTHER): Payer: Self-pay | Admitting: *Deleted

## 2023-12-11 NOTE — Telephone Encounter (Signed)
 Please call patient to schedule.

## 2023-12-11 NOTE — Telephone Encounter (Signed)
 Copied from CRM [id number]. Topic: Appointments - Scheduling Inquiry for Clinic >> Dec 11, 2023 11:45 AM Christina Weeks wrote: Pt would like to reschedule her lab appointment for the Monday before, however epic is not allowing me to add the department or provider in. It also did not auto generate it. Please contact pt for scheduling.

## 2023-12-17 ENCOUNTER — Other Ambulatory Visit (HOSPITAL_BASED_OUTPATIENT_CLINIC_OR_DEPARTMENT_OTHER): Payer: Self-pay | Admitting: Family Medicine

## 2023-12-17 ENCOUNTER — Other Ambulatory Visit (HOSPITAL_BASED_OUTPATIENT_CLINIC_OR_DEPARTMENT_OTHER)

## 2023-12-17 ENCOUNTER — Telehealth (HOSPITAL_BASED_OUTPATIENT_CLINIC_OR_DEPARTMENT_OTHER): Payer: Self-pay | Admitting: Family Medicine

## 2023-12-17 DIAGNOSIS — E559 Vitamin D deficiency, unspecified: Secondary | ICD-10-CM

## 2023-12-17 DIAGNOSIS — Z13220 Encounter for screening for lipoid disorders: Secondary | ICD-10-CM

## 2023-12-17 DIAGNOSIS — Z0000 Encounter for general adult medical examination without abnormal findings: Secondary | ICD-10-CM

## 2023-12-17 NOTE — Telephone Encounter (Signed)
 Patient came by the office for her labs she is new to seeing alexis but would like to establish care with her and has a physical on 7/25 please advise to which labs she would need ordered for today.  Thank you!

## 2023-12-18 ENCOUNTER — Other Ambulatory Visit (HOSPITAL_BASED_OUTPATIENT_CLINIC_OR_DEPARTMENT_OTHER): Payer: Medicaid Other

## 2023-12-18 LAB — COMPREHENSIVE METABOLIC PANEL WITH GFR
ALT: 14 IU/L (ref 0–32)
AST: 17 IU/L (ref 0–40)
Albumin: 4.1 g/dL (ref 4.0–5.0)
Alkaline Phosphatase: 92 IU/L (ref 44–121)
BUN/Creatinine Ratio: 16 (ref 9–23)
BUN: 10 mg/dL (ref 6–20)
Bilirubin Total: 0.3 mg/dL (ref 0.0–1.2)
CO2: 19 mmol/L — ABNORMAL LOW (ref 20–29)
Calcium: 9.1 mg/dL (ref 8.7–10.2)
Chloride: 103 mmol/L (ref 96–106)
Creatinine, Ser: 0.61 mg/dL (ref 0.57–1.00)
Globulin, Total: 2.4 g/dL (ref 1.5–4.5)
Glucose: 77 mg/dL (ref 70–99)
Potassium: 4.5 mmol/L (ref 3.5–5.2)
Sodium: 137 mmol/L (ref 134–144)
Total Protein: 6.5 g/dL (ref 6.0–8.5)
eGFR: 128 mL/min/1.73 (ref >59)

## 2023-12-18 LAB — CBC WITH DIFFERENTIAL/PLATELET
Basophils Absolute: 0.1 x10E3/uL (ref 0.0–0.2)
Basos: 1 %
EOS (ABSOLUTE): 0.2 x10E3/uL (ref 0.0–0.4)
Eos: 2 %
Hematocrit: 43.8 % (ref 34.0–46.6)
Hemoglobin: 14.2 g/dL (ref 11.1–15.9)
Immature Grans (Abs): 0.1 x10E3/uL (ref 0.0–0.1)
Immature Granulocytes: 1 %
Lymphocytes Absolute: 2.1 x10E3/uL (ref 0.7–3.1)
Lymphs: 25 %
MCH: 27.7 pg (ref 26.6–33.0)
MCHC: 32.4 g/dL (ref 31.5–35.7)
MCV: 86 fL (ref 79–97)
Monocytes Absolute: 0.5 x10E3/uL (ref 0.1–0.9)
Monocytes: 6 %
Neutrophils Absolute: 5.4 x10E3/uL (ref 1.4–7.0)
Neutrophils: 65 %
Platelets: 316 x10E3/uL (ref 150–450)
RBC: 5.12 x10E6/uL (ref 3.77–5.28)
RDW: 12.6 % (ref 11.7–15.4)
WBC: 8.3 x10E3/uL (ref 3.4–10.8)

## 2023-12-18 LAB — LIPID PANEL
Chol/HDL Ratio: 2.6 ratio (ref 0.0–4.4)
Cholesterol, Total: 135 mg/dL (ref 100–199)
HDL: 51 mg/dL (ref >39)
LDL Chol Calc (NIH): 74 mg/dL (ref 0–99)
Triglycerides: 42 mg/dL (ref 0–149)
VLDL Cholesterol Cal: 10 mg/dL (ref 5–40)

## 2023-12-18 LAB — VITAMIN D 25 HYDROXY (VIT D DEFICIENCY, FRACTURES): Vit D, 25-Hydroxy: 16.5 ng/mL — ABNORMAL LOW (ref 30.0–100.0)

## 2023-12-28 ENCOUNTER — Encounter (HOSPITAL_BASED_OUTPATIENT_CLINIC_OR_DEPARTMENT_OTHER): Payer: Self-pay | Admitting: Family Medicine

## 2023-12-28 ENCOUNTER — Ambulatory Visit (INDEPENDENT_AMBULATORY_CARE_PROVIDER_SITE_OTHER): Payer: Medicaid Other | Admitting: Family Medicine

## 2023-12-28 ENCOUNTER — Other Ambulatory Visit (HOSPITAL_BASED_OUTPATIENT_CLINIC_OR_DEPARTMENT_OTHER): Payer: Self-pay | Admitting: Family Medicine

## 2023-12-28 VITALS — BP 107/72 | HR 79 | Ht 63.0 in | Wt 164.6 lb

## 2023-12-28 DIAGNOSIS — E559 Vitamin D deficiency, unspecified: Secondary | ICD-10-CM | POA: Diagnosis not present

## 2023-12-28 DIAGNOSIS — Z0000 Encounter for general adult medical examination without abnormal findings: Secondary | ICD-10-CM | POA: Diagnosis not present

## 2023-12-28 DIAGNOSIS — N946 Dysmenorrhea, unspecified: Secondary | ICD-10-CM

## 2023-12-28 MED ORDER — VITAMIN D (ERGOCALCIFEROL) 1.25 MG (50000 UNIT) PO CAPS: 50000.0000 [IU] | ORAL_CAPSULE | ORAL | 1 refills | Status: DC

## 2023-12-28 NOTE — Progress Notes (Signed)
 Subjective:   Christina Weeks [birth year]  12/28/2023   CC: Chief Complaint  Patient presents with   Annual Exam    Patient is here today for her physical. States after her last menstrual cycle, she had a lot of back pain that was to the point it made her back very stiff and then also stated the pain radiated to her abdomen which lasted about 2 Weeks even after her cycle was finished.    HPI: Christina Weeks is a 23 y.o. female who presents for a routine health maintenance exam.  Labs collected at time of visit.    PAINFUL PERIOD:  Patient states she had severe one sided pain during her last period. She states she could not sit, stand or sleep comfortably due to pain. She states pain radiated from her back to her her left lower abdomen. She did try to use heating pad, Advil  without much improvement. She states pain lasted for 2 Weeks during her period and the week following her symptoms. She states she does have lower back pain prior to her period chronically. Reports pain is resolved unless she is lifting and bending her back frequently.  Denies nausea, vomiting, diarrhea, constipation, STI exposure, vaginal discharge, possible pregnancy, or dysuria.   Patient's last menstrual period was 12/06/2023 (exact date).    HEALTH SCREENINGS: - Vision Screening: up to date - Dental Visits: up to date - Pap smear: up to date - Breast Exam: Declined - STD Screening: Declined - Mammogram (40+): Not applicable  - Colonoscopy (45+): Not applicable  - Bone Density (65+ or under 65 with predisposing conditions): Not applicable  - Lung CA screening with low-dose CT:  Not applicable Adults age 4-80 who are current cigarette smokers or quit within the last 15 years. Must have 20 pack year history.   Depression and Anxiety Screen done today and results listed below:     12/28/2023   10:22 AM 12/25/2022   10:10 AM 12/19/2021    1:33 PM 01/20/2021    4:06 PM 12/25/2018     2:50 PM  Depression screen PHQ 2/9  Decreased Interest 0 0 0 0 0  Down, Depressed, Hopeless 0 0 0 0 0  PHQ - 2 Score 0 0 0 0 0  Altered sleeping 0  0  0  Tired, decreased energy 0  0  0  Change in appetite 0  0  0  Feeling bad or failure about yourself  0  0  0  Trouble concentrating 0  0  0  Moving slowly or fidgety/restless 0  0  0  Suicidal thoughts 0  0  0  PHQ-9 Score 0  0  0  Difficult doing work/chores Not difficult at all  Not difficult at all        12/28/2023   10:22 AM 12/25/2018    2:50 PM  GAD 7 : Generalized Anxiety Score  Nervous, Anxious, on Edge 0 0  Control/stop worrying 0 0  Worry too much - different things 0 0  Trouble relaxing 0 0  Restless 0 0  Easily annoyed or irritable 0 0  Afraid - awful might happen 0 0  Total GAD 7 Score 0 0  Anxiety Difficulty Not difficult at all     IMMUNIZATIONS: - Tdap: Tetanus vaccination status reviewed: last tetanus booster within 10 years. - HPV: Up to date - Influenza: Postponed to flu season - Pneumovax: Not applicable - Prevnar 20: Not applicable - Shingrix (50+):  Not applicable   Past medical history, surgical history, medications, allergies, family history and social history reviewed with patient today and changes made to appropriate areas of the chart.   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   No current facility-administered medications on file prior to visit.    No Known Allergies   Social History   Socioeconomic History   Marital status: Single    Spouse name: Not on file   Number of children: Not on file    Years of education: Not on file   Highest education level: Not on file  Occupational History   Not on file  Tobacco Use   Smoking status: Never   Smokeless tobacco: Never  Vaping Use   Vaping status: Never Used  Substance and Sexual Activity   Alcohol use: Never   Drug use: Never   Sexual activity: Not on file  Other Topics Concern   Not on file  Social History Narrative   Not on file   Social Drivers of Health   Financial Resource Strain: Not on file  Food Insecurity: Not on file  Transportation Needs: Not on file  Physical Activity: Not on file  Stress: Not on file  Social Connections: Not on file  Intimate Partner Violence: Not on file   Social History   Tobacco Use  Smoking Status Never  Smokeless Tobacco Never   Social History   Substance and Sexual Activity  Alcohol Use Never    Family History  Problem Relation Age of Onset   Diabetes Mother    Diabetes Maternal Grandmother    Diabetes Maternal Grandfather      ROS: Denies fever, fatigue, unexplained weight loss/gain, chest pain, SHOB, and palpitations. Denies neurological deficits, gastrointestinal or genitourinary complaints, and skin changes.   Objective:   Today's Vitals   12/28/23 1019  BP: 107/72  Pulse: 79  SpO2: 98%  Weight: 164 lb 9.6 oz (74.7 kg)  Height: 5' 3 (1.6 m)    GENERAL APPEARANCE: Well-appearing, in NAD. Well nourished.  SKIN: Pink, warm and dry. Turgor normal. No rash, lesion, ulceration, or ecchymoses. Hair evenly distributed.  HEENT: HEAD: Normocephalic.  EYES: PERRLA. EOMI. Lids intact w/o defect. Sclera white, Conjunctiva pink w/o exudate.  EARS: External ear w/o redness, swelling, masses or lesions. EAC clear. TM's intact, translucent w/o bulging, appropriate landmarks visualized. Appropriate acuity to conversational tones.  NOSE: Septum midline w/o deformity. Nares patent, mucosa pink and non-inflamed w/o drainage. No sinus tenderness.  THROAT: Uvula midline.  Oropharynx clear. Tonsils non-inflamed w/o exudate. Oral mucosa pink and moist.  NECK: Supple, Trachea midline. Full ROM w/o pain or tenderness. No lymphadenopathy. Thyroid  non-tender w/o enlargement or palpable masses.  RESPIRATORY: Chest wall symmetrical w/o masses. Respirations even and non-labored. Breath sounds clear to auscultation bilaterally. No wheezes, rales, rhonchi, or crackles. CARDIAC: S1, S2 present, regular rate and rhythm. No gallops, murmurs, rubs, or clicks. PMI w/o lifts, heaves, or thrills. No carotid bruits. Capillary refill <2 seconds. Peripheral pulses 2+ bilaterally. GI: Abdomen soft w/o distention. Normoactive bowel sounds. No palpable masses  or tenderness. No guarding or rebound tenderness. Liver and spleen w/o tenderness or enlargement. No CVA tenderness.  MSK: Muscle tone and strength appropriate for age, w/o atrophy or abnormal movement.  EXTREMITIES: Active ROM intact, w/o tenderness, crepitus, or contracture. No obvious joint deformities or effusions. No clubbing, edema, or cyanosis.  NEUROLOGIC: CN's II-XII intact. Motor strength symmetrical with no obvious weakness. No sensory deficits. DTR's 2+ symmetric bilaterally. Steady, even gait.  PSYCH/MENTAL STATUS: Alert, oriented x 3. Cooperative, appropriate mood and affect.    Results for orders placed or performed in visit on 12/17/23  CBC with Differential/Platelet   Collection Time: 12/17/23  9:18 AM  Result Value Ref Range   WBC 8.3 3.4 - 10.8 x10E3/uL   RBC 5.12 3.77 - 5.28 x10E6/uL   Hemoglobin 14.2 11.1 - 15.9 g/dL   Hematocrit 56.1 65.9 - 46.6 %   MCV 86 79 - 97 fL   MCH 27.7 26.6 - 33.0 pg   MCHC 32.4 31.5 - 35.7 g/dL   RDW 87.3 88.2 - 84.5 %   Platelets 316 150 - 450 x10E3/uL   Neutrophils 65 Not Estab. %   Lymphs 25 Not Estab. %   Monocytes 6 Not Estab. %   Eos 2 Not Estab. %   Basos 1 Not Estab. %   Neutrophils Absolute 5.4 1.4 - 7.0 x10E3/uL   Lymphocytes Absolute 2.1 0.7 - 3.1 x10E3/uL    Monocytes Absolute 0.5 0.1 - 0.9 x10E3/uL   EOS (ABSOLUTE) 0.2 0.0 - 0.4 x10E3/uL   Basophils Absolute 0.1 0.0 - 0.2 x10E3/uL   Immature Granulocytes 1 Not Estab. %   Immature Grans (Abs) 0.1 0.0 - 0.1 x10E3/uL  Comprehensive metabolic panel with GFR   Collection Time: 12/17/23  9:18 AM  Result Value Ref Range   Glucose 77 70 - 99 mg/dL   BUN 10 6 - 20 mg/dL   Creatinine, Ser 9.38 0.57 - 1.00 mg/dL   eGFR 871 >40 fO/fpw/8.26   BUN/Creatinine Ratio 16 9 - 23   Sodium 137 134 - 144 mmol/L   Potassium 4.5 3.5 - 5.2 mmol/L   Chloride 103 96 - 106 mmol/L   CO2 19 (L) 20 - 29 mmol/L   Calcium 9.1 8.7 - 10.2 mg/dL   Total Protein 6.5 6.0 - 8.5 g/dL   Albumin  4.1 4.0 - 5.0 g/dL   Globulin, Total 2.4 1.5 - 4.5 g/dL   Bilirubin Total 0.3 0.0 - 1.2 mg/dL   Alkaline Phosphatase 92 44 - 121 IU/L   AST 17 0 - 40 IU/L   ALT 14 0 - 32 IU/L  Lipid panel   Collection Time: 12/17/23  9:18 AM  Result Value Ref Range   Cholesterol, Total 135 100 - 199 mg/dL   Triglycerides 42 0 - 149 mg/dL   HDL 51 >60 mg/dL   VLDL Cholesterol Cal 10 5 - 40 mg/dL   LDL Chol Calc (NIH) 74 0 - 99 mg/dL   Chol/HDL Ratio 2.6 0.0 - 4.4 ratio  VITAMIN D  25 Hydroxy (Vit-D Deficiency, Fractures)   Collection Time: 12/17/23  9:18 AM  Result Value Ref Range   Vit D, 25-Hydroxy 16.5 (L) 30.0 - 100.0 ng/mL    Assessment & Plan:  1. Annual physical exam (Primary) Discussed preventative screenings, vaccines, and healthy lifestyle with patient. Lab results reviewed in depth with patient.   2. Vitamin D  deficiency Start Vitamin D  50,000 units weekly. Recommend increasing sources of vit d and calcium to help with absorption. Recheck  in 6 months.  - Vitamin D , Ergocalciferol , (DRISDOL ) 1.25 MG (50000 UNIT) CAPS capsule; Take 1 capsule (50,000 Units total) by mouth every 7 (seven) days.  Dispense: 12 capsule; Refill: 1  3. Painful menstrual periods Discussed possibility of dysmenorrhea, fibroid, or cyst contributing to  episode of severe pain. She denies needing testing for STI, pregnancy today. Recommend NSAID use 1 week prior to next period and to reach out to PCP if pain returns and will obtain US  at that time. Patient agreeable.    No orders of the defined types were placed in this encounter.   PATIENT COUNSELING:  - Encouraged a healthy well-balanced diet. Patient may adjust caloric intake to maintain or achieve ideal body weight. May reduce intake of dietary saturated fat and total fat and have adequate dietary potassium and calcium preferably from fresh fruits, vegetables, and low-fat dairy products.   - Advised to avoid cigarette smoking. - Discussed with the patient that most people either abstain from alcohol or drink within safe limits (<=14/week and <=4 drinks/occasion for males, <=7/Weeks and <= 3 drinks/occasion for females) and that the risk for alcohol disorders and other health effects rises proportionally with the number of drinks per week and how often a drinker exceeds daily limits. - Discussed cessation/primary prevention of drug use and availability of treatment for abuse.  - Discussed sexually transmitted diseases, avoidance of unintended pregnancy and contraceptive alternatives.  - Stressed the importance of regular exercise - Injury prevention: Discussed safety belts, safety helmets, smoke detector, smoking near bedding or upholstery.  - Dental health: Discussed importance of regular tooth brushing, flossing, and dental visits.   NEXT PREVENTATIVE PHYSICAL DUE IN 1 YEAR.  Return for 6 months: lab visit only; 1 year AE.  Patient to reach out to office if new, worrisome, or unresolved symptoms arise or if no improvement in patient's condition. Patient verbalized understanding and is agreeable to treatment plan. All questions answered to patient's satisfaction.    Thersia Schuyler Stark, [place]

## 2024-03-24 ENCOUNTER — Telehealth (HOSPITAL_BASED_OUTPATIENT_CLINIC_OR_DEPARTMENT_OTHER): Payer: Self-pay | Admitting: *Deleted

## 2024-03-24 NOTE — Telephone Encounter (Signed)
Routing to Amanda for review.

## 2024-03-24 NOTE — Telephone Encounter (Signed)
 Copied from CRM #[id number]. Topic: Clinical - Medication Question >> Mar 24, 2024  1:26 PM Anairis L wrote: Reason for CRM: Pharmacy informed patient that Vitamin D , Ergocalciferol , (DRISDOL ) 1.25 MG (50000 UNIT) CAPS capsule Was denied, I do not see a refusal on our end.    Please advised.

## 2024-06-18 ENCOUNTER — Other Ambulatory Visit (HOSPITAL_BASED_OUTPATIENT_CLINIC_OR_DEPARTMENT_OTHER): Payer: Self-pay | Admitting: Family Medicine

## 2024-06-18 DIAGNOSIS — E559 Vitamin D deficiency, unspecified: Secondary | ICD-10-CM

## 2024-06-30 ENCOUNTER — Other Ambulatory Visit (HOSPITAL_BASED_OUTPATIENT_CLINIC_OR_DEPARTMENT_OTHER)

## 2024-07-07 ENCOUNTER — Ambulatory Visit (HOSPITAL_BASED_OUTPATIENT_CLINIC_OR_DEPARTMENT_OTHER)

## 2024-07-07 ENCOUNTER — Encounter (HOSPITAL_BASED_OUTPATIENT_CLINIC_OR_DEPARTMENT_OTHER): Payer: Self-pay

## 2024-07-08 ENCOUNTER — Ambulatory Visit (HOSPITAL_BASED_OUTPATIENT_CLINIC_OR_DEPARTMENT_OTHER)

## 2024-07-08 DIAGNOSIS — E559 Vitamin D deficiency, unspecified: Secondary | ICD-10-CM

## 2024-07-09 ENCOUNTER — Ambulatory Visit (HOSPITAL_BASED_OUTPATIENT_CLINIC_OR_DEPARTMENT_OTHER): Payer: Self-pay | Admitting: Family Medicine

## 2024-07-09 LAB — VITAMIN D 25 HYDROXY (VIT D DEFICIENCY, FRACTURES): Vit D, 25-Hydroxy: 35 ng/mL (ref 30.0–100.0)

## 2024-07-09 NOTE — Progress Notes (Signed)
 Hi Christina Weeks,  Your Vitamin D  is in normal range now. Please continue the supplementation prescribed.

## 2024-12-30 ENCOUNTER — Encounter (HOSPITAL_BASED_OUTPATIENT_CLINIC_OR_DEPARTMENT_OTHER): Admitting: Family Medicine
